# Patient Record
Sex: Male | Born: 1940 | Race: White | Hispanic: No | Marital: Married | Smoking: Former smoker
Health system: Southern US, Community
[De-identification: ages and names within clinical notes are randomized; demographics above are authoritative.]

## PROBLEM LIST (undated history)

## (undated) DIAGNOSIS — D696 Thrombocytopenia, unspecified: Secondary | ICD-10-CM

## (undated) DIAGNOSIS — K30 Functional dyspepsia: Secondary | ICD-10-CM

## (undated) DIAGNOSIS — I723 Aneurysm of iliac artery: Secondary | ICD-10-CM

## (undated) DIAGNOSIS — I701 Atherosclerosis of renal artery: Secondary | ICD-10-CM

## (undated) DIAGNOSIS — I2510 Atherosclerotic heart disease of native coronary artery without angina pectoris: Secondary | ICD-10-CM

## (undated) DIAGNOSIS — I712 Thoracic aortic aneurysm, without rupture, unspecified: Secondary | ICD-10-CM

## (undated) DIAGNOSIS — I714 Abdominal aortic aneurysm, without rupture, unspecified: Secondary | ICD-10-CM

## (undated) DIAGNOSIS — IMO0001 Reserved for inherently not codable concepts without codable children: Secondary | ICD-10-CM

## (undated) DIAGNOSIS — D693 Immune thrombocytopenic purpura: Secondary | ICD-10-CM

## (undated) DIAGNOSIS — I10 Essential (primary) hypertension: Secondary | ICD-10-CM

## (undated) DIAGNOSIS — I82409 Acute embolism and thrombosis of unspecified deep veins of unspecified lower extremity: Secondary | ICD-10-CM

## (undated) DIAGNOSIS — E7800 Pure hypercholesterolemia, unspecified: Secondary | ICD-10-CM

## (undated) DIAGNOSIS — R9431 Abnormal electrocardiogram [ECG] [EKG]: Secondary | ICD-10-CM

## (undated) DIAGNOSIS — R110 Nausea: Secondary | ICD-10-CM

## (undated) DIAGNOSIS — I739 Peripheral vascular disease, unspecified: Secondary | ICD-10-CM

## (undated) HISTORY — DX: Thoracic aortic aneurysm, without rupture, unspecified: I71.20

## (undated) HISTORY — PX: ROTATOR CUFF REPAIR W/ DISTAL CLAVICLE EXCISION: SHX2365

## (undated) HISTORY — DX: Abdominal aortic aneurysm, without rupture: I71.4

## (undated) HISTORY — PX: LUMBAR SPINE SURGERY: SHX701

## (undated) HISTORY — DX: Nausea: R11.0

## (undated) HISTORY — DX: Reserved for inherently not codable concepts without codable children: IMO0001

## (undated) HISTORY — PX: CHOLECYSTECTOMY: SHX55

## (undated) HISTORY — PX: ABDOMINAL AORTIC ANEURYSM REPAIR: SUR1152

## (undated) HISTORY — PX: INGUINAL HERNIA REPAIR: SHX194

## (undated) HISTORY — DX: Abnormal electrocardiogram (ECG) (EKG): R94.31

## (undated) HISTORY — DX: Atherosclerotic heart disease of native coronary artery without angina pectoris: I25.10

## (undated) HISTORY — DX: Aneurysm of iliac artery: I72.3

## (undated) HISTORY — DX: Essential (primary) hypertension: I10

## (undated) HISTORY — DX: Pure hypercholesterolemia, unspecified: E78.00

## (undated) HISTORY — DX: Acute embolism and thrombosis of unspecified deep veins of unspecified lower extremity: I82.409

## (undated) HISTORY — DX: Functional dyspepsia: K30

## (undated) HISTORY — DX: Immune thrombocytopenic purpura: D69.3

## (undated) HISTORY — DX: Peripheral vascular disease, unspecified: I73.9

## (undated) HISTORY — DX: Thoracic aortic aneurysm, without rupture: I71.2

## (undated) HISTORY — DX: Atherosclerosis of renal artery: I70.1

## (undated) HISTORY — DX: Thrombocytopenia, unspecified: D69.6

## (undated) HISTORY — PX: ILIAC ARTERY ANEURYSM REPAIR: SUR1158

## (undated) HISTORY — DX: Abdominal aortic aneurysm, without rupture, unspecified: I71.40

## (undated) HISTORY — PX: UMBILICAL HERNIA REPAIR: SHX196

---

## 2006-12-24 ENCOUNTER — Ambulatory Visit: Payer: Self-pay | Admitting: Vascular Surgery

## 2007-03-03 ENCOUNTER — Encounter: Admission: RE | Admit: 2007-03-03 | Discharge: 2007-03-03 | Payer: Self-pay | Admitting: Sports Medicine

## 2008-02-10 ENCOUNTER — Ambulatory Visit: Payer: Self-pay | Admitting: Vascular Surgery

## 2008-03-05 HISTORY — PX: CARDIAC CATHETERIZATION: SHX172

## 2008-08-06 ENCOUNTER — Encounter: Admission: RE | Admit: 2008-08-06 | Discharge: 2008-08-06 | Payer: Self-pay | Admitting: Vascular Surgery

## 2008-08-10 ENCOUNTER — Ambulatory Visit: Payer: Self-pay | Admitting: Surgery

## 2008-08-30 ENCOUNTER — Encounter: Admission: RE | Admit: 2008-08-30 | Discharge: 2008-08-30 | Payer: Self-pay | Admitting: Orthopedic Surgery

## 2008-11-16 ENCOUNTER — Inpatient Hospital Stay (HOSPITAL_COMMUNITY): Admission: RE | Admit: 2008-11-16 | Discharge: 2008-11-18 | Payer: Self-pay | Admitting: *Deleted

## 2008-11-17 ENCOUNTER — Ambulatory Visit: Payer: Self-pay | Admitting: Vascular Surgery

## 2008-11-17 ENCOUNTER — Encounter: Payer: Self-pay | Admitting: Surgery

## 2009-05-20 ENCOUNTER — Ambulatory Visit (HOSPITAL_COMMUNITY): Admission: RE | Admit: 2009-05-20 | Discharge: 2009-05-21 | Payer: Self-pay | Admitting: Orthopedic Surgery

## 2009-08-16 ENCOUNTER — Ambulatory Visit: Payer: Self-pay | Admitting: Surgery

## 2009-08-16 ENCOUNTER — Encounter: Admission: RE | Admit: 2009-08-16 | Discharge: 2009-08-16 | Payer: Self-pay | Admitting: Surgery

## 2009-11-03 ENCOUNTER — Encounter: Admission: RE | Admit: 2009-11-03 | Discharge: 2009-11-03 | Payer: Self-pay | Admitting: Internal Medicine

## 2009-11-10 ENCOUNTER — Ambulatory Visit: Payer: Self-pay | Admitting: Oncology

## 2009-11-17 LAB — CBC WITH DIFFERENTIAL/PLATELET
Basophils Absolute: 0 10*3/uL (ref 0.0–0.1)
Eosinophils Absolute: 0.2 10*3/uL (ref 0.0–0.5)
HCT: 43.7 % (ref 38.4–49.9)
HGB: 14.4 g/dL (ref 13.0–17.1)
MCH: 27.6 pg (ref 27.2–33.4)
MONO#: 0.5 10*3/uL (ref 0.1–0.9)
NEUT#: 5 10*3/uL (ref 1.5–6.5)
NEUT%: 76.7 % — ABNORMAL HIGH (ref 39.0–75.0)
WBC: 6.5 10*3/uL (ref 4.0–10.3)
lymph#: 0.8 10*3/uL — ABNORMAL LOW (ref 0.9–3.3)

## 2009-11-17 LAB — MORPHOLOGY: PLT EST: DECREASED

## 2009-11-17 LAB — CHCC SMEAR

## 2009-11-18 LAB — HIV ANTIBODY (ROUTINE TESTING W REFLEX)

## 2009-11-21 LAB — ANTI-NUCLEAR AB-TITER (ANA TITER): ANA Titer 1: NEGATIVE

## 2009-11-21 LAB — PROTEIN ELECTROPHORESIS, SERUM
Albumin ELP: 61.8 % (ref 55.8–66.1)
Alpha-1-Globulin: 4.2 % (ref 2.9–4.9)
Alpha-2-Globulin: 10.4 % (ref 7.1–11.8)
Beta 2: 3.8 % (ref 3.2–6.5)
Beta Globulin: 6.3 % (ref 4.7–7.2)
Gamma Globulin: 13.5 % (ref 11.1–18.8)
Total Protein, Serum Electrophoresis: 6.9 g/dL (ref 6.0–8.3)

## 2009-11-21 LAB — HEPATITIS B SURFACE ANTIGEN: Hepatitis B Surface Ag: NEGATIVE

## 2009-11-21 LAB — HEPATITIS C ANTIBODY: HCV Ab: NEGATIVE

## 2009-11-21 LAB — COMPREHENSIVE METABOLIC PANEL WITH GFR
ALT: 57 U/L — ABNORMAL HIGH (ref 0–53)
AST: 60 U/L — ABNORMAL HIGH (ref 0–37)
Albumin: 4.6 g/dL (ref 3.5–5.2)
Alkaline Phosphatase: 51 U/L (ref 39–117)
BUN: 25 mg/dL — ABNORMAL HIGH (ref 6–23)
CO2: 29 meq/L (ref 19–32)
Calcium: 9.5 mg/dL (ref 8.4–10.5)
Chloride: 104 meq/L (ref 96–112)
Creatinine, Ser: 1.5 mg/dL (ref 0.40–1.50)
Glucose, Bld: 91 mg/dL (ref 70–99)
Potassium: 4.8 meq/L (ref 3.5–5.3)
Sodium: 142 meq/L (ref 135–145)
Total Bilirubin: 0.8 mg/dL (ref 0.3–1.2)
Total Protein: 6.9 g/dL (ref 6.0–8.3)

## 2009-11-21 LAB — VITAMIN B12: Vitamin B-12: 336 pg/mL (ref 211–911)

## 2009-11-21 LAB — ANA: Anti Nuclear Antibody(ANA): POSITIVE — AB

## 2009-11-21 LAB — LACTATE DEHYDROGENASE: LDH: 289 U/L — ABNORMAL HIGH (ref 94–250)

## 2009-11-21 LAB — HEPATITIS B CORE ANTIBODY, IGM: Hep B C IgM: NEGATIVE

## 2009-11-21 LAB — HEPATITIS B CORE ANTIBODY, TOTAL: Hep B Core Total Ab: NEGATIVE

## 2009-11-21 LAB — FOLATE: Folate: >20 ng/mL

## 2009-11-29 ENCOUNTER — Ambulatory Visit (HOSPITAL_COMMUNITY): Admission: RE | Admit: 2009-11-29 | Discharge: 2009-11-29 | Payer: Self-pay | Admitting: Oncology

## 2009-12-15 ENCOUNTER — Ambulatory Visit: Payer: Self-pay | Admitting: Oncology

## 2009-12-16 LAB — CBC WITH DIFFERENTIAL/PLATELET
BASO%: 0.4 % (ref 0.0–2.0)
Basophils Absolute: 0 10*3/uL (ref 0.0–0.1)
EOS%: 3.5 % (ref 0.0–7.0)
HGB: 14.2 g/dL (ref 13.0–17.1)
MCH: 29.1 pg (ref 27.2–33.4)
MONO#: 0.5 10*3/uL (ref 0.1–0.9)
RDW: 14 % (ref 11.0–14.6)
WBC: 6.6 10*3/uL (ref 4.0–10.3)
lymph#: 0.9 10*3/uL (ref 0.9–3.3)

## 2009-12-20 ENCOUNTER — Ambulatory Visit: Payer: Self-pay | Admitting: Cardiology

## 2009-12-20 LAB — CBC WITH DIFFERENTIAL/PLATELET
Eosinophils Absolute: 0.1 10*3/uL (ref 0.0–0.5)
MONO#: 0.5 10*3/uL (ref 0.1–0.9)
NEUT#: 8.7 10*3/uL — ABNORMAL HIGH (ref 1.5–6.5)
Platelets: 85 10*3/uL — ABNORMAL LOW (ref 140–400)
RBC: 4.9 10*6/uL (ref 4.20–5.82)
RDW: 14.8 % — ABNORMAL HIGH (ref 11.0–14.6)
WBC: 10.1 10*3/uL (ref 4.0–10.3)

## 2009-12-28 LAB — CBC WITH DIFFERENTIAL/PLATELET
Eosinophils Absolute: 0.1 10*3/uL (ref 0.0–0.5)
HCT: 44.8 % (ref 38.4–49.9)
HGB: 15.6 g/dL (ref 13.0–17.1)
LYMPH%: 12.5 % — ABNORMAL LOW (ref 14.0–49.0)
MONO#: 0.7 10*3/uL (ref 0.1–0.9)
NEUT#: 10.8 10*3/uL — ABNORMAL HIGH (ref 1.5–6.5)
NEUT%: 80.7 % — ABNORMAL HIGH (ref 39.0–75.0)
Platelets: 100 10*3/uL — ABNORMAL LOW (ref 140–400)
WBC: 13.4 10*3/uL — ABNORMAL HIGH (ref 4.0–10.3)

## 2009-12-28 LAB — COMPREHENSIVE METABOLIC PANEL
CO2: 24 mEq/L (ref 19–32)
Creatinine, Ser: 1.13 mg/dL (ref 0.40–1.50)
Glucose, Bld: 90 mg/dL (ref 70–99)
Sodium: 131 mEq/L — ABNORMAL LOW (ref 135–145)
Total Bilirubin: 0.5 mg/dL (ref 0.3–1.2)
Total Protein: 6.4 g/dL (ref 6.0–8.3)

## 2009-12-28 LAB — LACTATE DEHYDROGENASE: LDH: 207 U/L (ref 94–250)

## 2010-01-04 LAB — CBC WITH DIFFERENTIAL/PLATELET
BASO%: 0 % (ref 0.0–2.0)
EOS%: 0.3 % (ref 0.0–7.0)
MCH: 29.4 pg (ref 27.2–33.4)
MCV: 84.8 fL (ref 79.3–98.0)
MONO%: 1 % (ref 0.0–14.0)
RBC: 5.21 10*6/uL (ref 4.20–5.82)
RDW: 14.3 % (ref 11.0–14.6)
lymph#: 0.4 10*3/uL — ABNORMAL LOW (ref 0.9–3.3)

## 2010-01-11 LAB — COMPREHENSIVE METABOLIC PANEL
Albumin: 4.1 g/dL (ref 3.5–5.2)
Alkaline Phosphatase: 40 U/L (ref 39–117)
CO2: 26 mEq/L (ref 19–32)
Chloride: 97 mEq/L (ref 96–112)
Glucose, Bld: 127 mg/dL — ABNORMAL HIGH (ref 70–99)
Potassium: 4.2 mEq/L (ref 3.5–5.3)
Sodium: 131 mEq/L — ABNORMAL LOW (ref 135–145)
Total Protein: 6.2 g/dL (ref 6.0–8.3)

## 2010-01-11 LAB — CBC WITH DIFFERENTIAL/PLATELET
Eosinophils Absolute: 0 10*3/uL (ref 0.0–0.5)
MONO#: 0.2 10*3/uL (ref 0.1–0.9)
MONO%: 2 % (ref 0.0–14.0)
NEUT#: 8.3 10*3/uL — ABNORMAL HIGH (ref 1.5–6.5)
RBC: 5.08 10*6/uL (ref 4.20–5.82)
RDW: 14.2 % (ref 11.0–14.6)
WBC: 8.9 10*3/uL (ref 4.0–10.3)
lymph#: 0.4 10*3/uL — ABNORMAL LOW (ref 0.9–3.3)

## 2010-01-16 ENCOUNTER — Ambulatory Visit: Payer: Self-pay | Admitting: Oncology

## 2010-01-18 LAB — CBC WITH DIFFERENTIAL/PLATELET
EOS%: 0.6 % (ref 0.0–7.0)
Eosinophils Absolute: 0 10*3/uL (ref 0.0–0.5)
MCV: 85.2 fL (ref 79.3–98.0)
MONO%: 2.3 % (ref 0.0–14.0)
NEUT#: 7.6 10*3/uL — ABNORMAL HIGH (ref 1.5–6.5)
RBC: 4.68 10*6/uL (ref 4.20–5.82)
RDW: 14.1 % (ref 11.0–14.6)

## 2010-01-27 LAB — COMPREHENSIVE METABOLIC PANEL
Albumin: 4.3 g/dL (ref 3.5–5.2)
CO2: 28 mEq/L (ref 19–32)
Glucose, Bld: 161 mg/dL — ABNORMAL HIGH (ref 70–99)
Potassium: 4.2 mEq/L (ref 3.5–5.3)
Sodium: 134 mEq/L — ABNORMAL LOW (ref 135–145)
Total Protein: 6.1 g/dL (ref 6.0–8.3)

## 2010-01-27 LAB — CBC WITH DIFFERENTIAL/PLATELET
Eosinophils Absolute: 0 10*3/uL (ref 0.0–0.5)
MONO#: 0.2 10*3/uL (ref 0.1–0.9)
NEUT#: 7.4 10*3/uL — ABNORMAL HIGH (ref 1.5–6.5)
RBC: 4.88 10*6/uL (ref 4.20–5.82)
RDW: 14.5 % (ref 11.0–14.6)
WBC: 8.4 10*3/uL (ref 4.0–10.3)

## 2010-02-09 LAB — CBC WITH DIFFERENTIAL/PLATELET
BASO%: 0.3 % (ref 0.0–2.0)
Basophils Absolute: 0 10*3/uL (ref 0.0–0.1)
EOS%: 0.9 % (ref 0.0–7.0)
HCT: 38.1 % — ABNORMAL LOW (ref 38.4–49.9)
HGB: 13.2 g/dL (ref 13.0–17.1)
LYMPH%: 9.6 % — ABNORMAL LOW (ref 14.0–49.0)
MCH: 28.7 pg (ref 27.2–33.4)
MCHC: 34.6 g/dL (ref 32.0–36.0)
MCV: 82.8 fL (ref 79.3–98.0)
MONO%: 6.2 % (ref 0.0–14.0)
NEUT%: 83 % — ABNORMAL HIGH (ref 39.0–75.0)
lymph#: 0.8 10*3/uL — ABNORMAL LOW (ref 0.9–3.3)

## 2010-02-20 ENCOUNTER — Ambulatory Visit: Payer: Self-pay | Admitting: Oncology

## 2010-02-21 LAB — CBC WITH DIFFERENTIAL/PLATELET
BASO%: 1.1 % (ref 0.0–2.0)
Basophils Absolute: 0.1 10*3/uL (ref 0.0–0.1)
EOS%: 1.2 % (ref 0.0–7.0)
HCT: 37.8 % — ABNORMAL LOW (ref 38.4–49.9)
HGB: 13.1 g/dL (ref 13.0–17.1)
MCH: 29.6 pg (ref 27.2–33.4)
MCHC: 34.8 g/dL (ref 32.0–36.0)
MCV: 85.1 fL (ref 79.3–98.0)
MONO%: 4.1 % (ref 0.0–14.0)
NEUT%: 85.7 % — ABNORMAL HIGH (ref 39.0–75.0)
RDW: 15.3 % — ABNORMAL HIGH (ref 11.0–14.6)

## 2010-02-21 LAB — COMPREHENSIVE METABOLIC PANEL
ALT: 34 U/L (ref 0–53)
AST: 32 U/L (ref 0–37)
Alkaline Phosphatase: 38 U/L — ABNORMAL LOW (ref 39–117)
BUN: 16 mg/dL (ref 6–23)
Calcium: 9.1 mg/dL (ref 8.4–10.5)
Chloride: 102 mEq/L (ref 96–112)
Creatinine, Ser: 0.92 mg/dL (ref 0.40–1.50)
Total Bilirubin: 0.4 mg/dL (ref 0.3–1.2)

## 2010-02-28 LAB — CBC WITH DIFFERENTIAL/PLATELET
BASO%: 0.2 % (ref 0.0–2.0)
Basophils Absolute: 0 10*3/uL (ref 0.0–0.1)
EOS%: 0.8 % (ref 0.0–7.0)
HCT: 38.2 % — ABNORMAL LOW (ref 38.4–49.9)
HGB: 13.4 g/dL (ref 13.0–17.1)
LYMPH%: 6.3 % — ABNORMAL LOW (ref 14.0–49.0)
MCH: 29.9 pg (ref 27.2–33.4)
MCHC: 35 g/dL (ref 32.0–36.0)
MCV: 85.4 fL (ref 79.3–98.0)
NEUT%: 88.1 % — ABNORMAL HIGH (ref 39.0–75.0)
Platelets: 102 10*3/uL — ABNORMAL LOW (ref 140–400)
lymph#: 0.6 10*3/uL — ABNORMAL LOW (ref 0.9–3.3)

## 2010-03-22 ENCOUNTER — Ambulatory Visit: Payer: Self-pay | Admitting: Oncology

## 2010-03-23 LAB — COMPREHENSIVE METABOLIC PANEL
ALT: 20 U/L (ref 0–53)
AST: 20 U/L (ref 0–37)
Albumin: 4.5 g/dL (ref 3.5–5.2)
Alkaline Phosphatase: 37 U/L — ABNORMAL LOW (ref 39–117)
BUN: 15 mg/dL (ref 6–23)
CO2: 28 mEq/L (ref 19–32)
Calcium: 9.7 mg/dL (ref 8.4–10.5)
Chloride: 103 mEq/L (ref 96–112)
Creatinine, Ser: 1.04 mg/dL (ref 0.40–1.50)
Glucose, Bld: 111 mg/dL — ABNORMAL HIGH (ref 70–99)
Potassium: 3.9 mEq/L (ref 3.5–5.3)
Sodium: 140 mEq/L (ref 135–145)
Total Bilirubin: 0.5 mg/dL (ref 0.3–1.2)
Total Protein: 6.5 g/dL (ref 6.0–8.3)

## 2010-03-23 LAB — CBC WITH DIFFERENTIAL/PLATELET
BASO%: 0.3 % (ref 0.0–2.0)
Basophils Absolute: 0 10*3/uL (ref 0.0–0.1)
EOS%: 1.4 % (ref 0.0–7.0)
Eosinophils Absolute: 0.1 10*3/uL (ref 0.0–0.5)
HCT: 39.6 % (ref 38.4–49.9)
HGB: 13.3 g/dL (ref 13.0–17.1)
LYMPH%: 7.1 % — ABNORMAL LOW (ref 14.0–49.0)
MCH: 28.9 pg (ref 27.2–33.4)
MCHC: 33.6 g/dL (ref 32.0–36.0)
MCV: 85.9 fL (ref 79.3–98.0)
MONO#: 0.5 10*3/uL (ref 0.1–0.9)
MONO%: 6 % (ref 0.0–14.0)
NEUT#: 6.6 10*3/uL — ABNORMAL HIGH (ref 1.5–6.5)
NEUT%: 85.2 % — ABNORMAL HIGH (ref 39.0–75.0)
Platelets: 116 10*3/uL — ABNORMAL LOW (ref 140–400)
RBC: 4.61 10*6/uL (ref 4.20–5.82)
RDW: 15.6 % — ABNORMAL HIGH (ref 11.0–14.6)
WBC: 7.8 10*3/uL (ref 4.0–10.3)
lymph#: 0.6 10*3/uL — ABNORMAL LOW (ref 0.9–3.3)

## 2010-03-26 ENCOUNTER — Encounter: Payer: Self-pay | Admitting: Sports Medicine

## 2010-03-27 ENCOUNTER — Ambulatory Visit: Payer: Self-pay | Admitting: Cardiology

## 2010-04-20 ENCOUNTER — Encounter (HOSPITAL_BASED_OUTPATIENT_CLINIC_OR_DEPARTMENT_OTHER): Payer: Medicare Other | Admitting: Oncology

## 2010-04-20 ENCOUNTER — Other Ambulatory Visit: Payer: Self-pay | Admitting: Oncology

## 2010-04-20 DIAGNOSIS — D693 Immune thrombocytopenic purpura: Secondary | ICD-10-CM

## 2010-04-20 DIAGNOSIS — IMO0002 Reserved for concepts with insufficient information to code with codable children: Secondary | ICD-10-CM

## 2010-04-20 LAB — CBC WITH DIFFERENTIAL/PLATELET
BASO%: 0.5 % (ref 0.0–2.0)
HCT: 35.8 % — ABNORMAL LOW (ref 38.4–49.9)
MCHC: 34.5 g/dL (ref 32.0–36.0)
MONO#: 0.4 10*3/uL (ref 0.1–0.9)
NEUT%: 80.3 % — ABNORMAL HIGH (ref 39.0–75.0)
RDW: 14.5 % (ref 11.0–14.6)
WBC: 7.5 10*3/uL (ref 4.0–10.3)
lymph#: 0.8 10*3/uL — ABNORMAL LOW (ref 0.9–3.3)

## 2010-05-17 ENCOUNTER — Encounter (HOSPITAL_BASED_OUTPATIENT_CLINIC_OR_DEPARTMENT_OTHER): Payer: Medicare Other | Admitting: Oncology

## 2010-05-17 ENCOUNTER — Other Ambulatory Visit: Payer: Self-pay | Admitting: Oncology

## 2010-05-17 DIAGNOSIS — D696 Thrombocytopenia, unspecified: Secondary | ICD-10-CM

## 2010-05-17 DIAGNOSIS — IMO0002 Reserved for concepts with insufficient information to code with codable children: Secondary | ICD-10-CM

## 2010-05-17 DIAGNOSIS — D693 Immune thrombocytopenic purpura: Secondary | ICD-10-CM

## 2010-05-17 LAB — CBC WITH DIFFERENTIAL/PLATELET
BASO%: 0.5 % (ref 0.0–2.0)
Eosinophils Absolute: 0.2 10*3/uL (ref 0.0–0.5)
MCV: 84.3 fL (ref 79.3–98.0)
MONO#: 0.3 10*3/uL (ref 0.1–0.9)
MONO%: 5.4 % (ref 0.0–14.0)
NEUT#: 4.4 10*3/uL (ref 1.5–6.5)
RBC: 4.22 10*6/uL (ref 4.20–5.82)
RDW: 13.5 % (ref 11.0–14.6)
WBC: 5.8 10*3/uL (ref 4.0–10.3)

## 2010-05-17 LAB — COMPREHENSIVE METABOLIC PANEL
ALT: 18 U/L (ref 0–53)
Albumin: 4.1 g/dL (ref 3.5–5.2)
Alkaline Phosphatase: 44 U/L (ref 39–117)
Glucose, Bld: 101 mg/dL — ABNORMAL HIGH (ref 70–99)
Potassium: 4.6 mEq/L (ref 3.5–5.3)
Sodium: 139 mEq/L (ref 135–145)
Total Protein: 6.4 g/dL (ref 6.0–8.3)

## 2010-05-18 LAB — BONE MARROW EXAM: Bone Marrow Exam: 693

## 2010-05-18 LAB — PROTIME-INR: Prothrombin Time: 14.8 seconds (ref 11.6–15.2)

## 2010-05-18 LAB — CBC
MCH: 29 pg (ref 26.0–34.0)
MCHC: 34.6 g/dL (ref 30.0–36.0)
Platelets: 50 10*3/uL — ABNORMAL LOW (ref 150–400)
RBC: 4.82 MIL/uL (ref 4.22–5.81)
RDW: 14.5 % (ref 11.5–15.5)

## 2010-05-29 LAB — CBC
Platelets: 51 10*3/uL — ABNORMAL LOW (ref 150–400)
RDW: 15.4 % (ref 11.5–15.5)

## 2010-05-29 LAB — BASIC METABOLIC PANEL
BUN: 13 mg/dL (ref 6–23)
Calcium: 9.1 mg/dL (ref 8.4–10.5)
GFR calc non Af Amer: >60 mL/min (ref >60)
Glucose, Bld: 82 mg/dL (ref 70–99)

## 2010-06-09 LAB — CBC
Hemoglobin: 11.7 g/dL — ABNORMAL LOW (ref 13.0–17.0)
MCHC: 34.9 g/dL (ref 30.0–36.0)
MCV: 85.4 fL (ref 78.0–100.0)
MCV: 85.5 fL (ref 78.0–100.0)
Platelets: 55 10*3/uL — ABNORMAL LOW (ref 150–400)
RBC: 4.08 MIL/uL — ABNORMAL LOW (ref 4.22–5.81)
RDW: 14.1 % (ref 11.5–15.5)
WBC: 5.5 10*3/uL (ref 4.0–10.5)

## 2010-06-09 LAB — BASIC METABOLIC PANEL
Calcium: 8.6 mg/dL (ref 8.4–10.5)
Chloride: 107 mEq/L (ref 96–112)
Creatinine, Ser: 1.12 mg/dL (ref 0.4–1.5)
GFR calc Af Amer: >60 mL/min (ref >60)
GFR calc non Af Amer: >60 mL/min (ref >60)

## 2010-06-09 LAB — TROPONIN I
Troponin I: 0.04 ng/mL (ref 0.00–0.06)
Troponin I: 0.3 ng/mL — ABNORMAL HIGH (ref 0.00–0.06)

## 2010-06-09 LAB — COMPREHENSIVE METABOLIC PANEL
ALT: 31 U/L (ref 0–53)
Calcium: 9 mg/dL (ref 8.4–10.5)
Creatinine, Ser: 1.11 mg/dL (ref 0.4–1.5)
GFR calc Af Amer: >60 mL/min (ref >60)
Glucose, Bld: 107 mg/dL — ABNORMAL HIGH (ref 70–99)
Sodium: 142 mEq/L (ref 135–145)
Total Protein: 5.8 g/dL — ABNORMAL LOW (ref 6.0–8.3)

## 2010-06-09 LAB — CARDIAC PANEL(CRET KIN+CKTOT+MB+TROPI)
Relative Index: INVALID (ref 0.0–2.5)
Total CK: 56 U/L (ref 7–232)

## 2010-06-09 LAB — CK TOTAL AND CKMB (NOT AT ARMC)
CK, MB: 1.8 ng/mL (ref 0.3–4.0)
CK, MB: 2.9 ng/mL (ref 0.3–4.0)
Relative Index: INVALID (ref 0.0–2.5)
Relative Index: INVALID (ref 0.0–2.5)
Total CK: 93 U/L (ref 7–232)

## 2010-07-18 NOTE — Assessment & Plan Note (Signed)
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  I had last seen him in followup in  October of 2008 when he was having some mild left groin pain.  I did not  feel a hernia and he had elected not to consider a CT of his abdomen to  work this up further.  These symptoms have essentially resolved although  he occasionally has some mild groin pain.  He had recently presented for  cholecystectomy and underwent open cholecystectomy.  He apparently had  suspicion for a pulmonary embolus postoperatively and a CT angiogram of  the chest was obtained.  This showed no evidence of pulmonary embolus.  An incidental finding was mild ectasia of the ascending aorta with a  maximum diameter of 4.1 cm.  He was referred to our office.   The patient has had no significant abdominal or back pain.  He has had  no recent chest pain, chest pressure, palpitations or pleuritic chest  pain.   REVIEW OF SYSTEMS:  He does occasionally have some wheezing and has a  history of COPD.  He also has a history of reflux.  In addition he has  arthritis and joint pain.  His review of systems is otherwise  unremarkable.   PHYSICAL EXAMINATION:  General:  This is a pleasant 70 year old  gentleman who appears his stated age.  Vital signs:  His blood pressure  is 102/71, heart rate is 84.  Neck:  Supple.  I do not detect any  carotid bruits.  Lungs:  Are clear bilaterally to auscultation.  Cardiac:  He has a regular rate and rhythm.  I do not appreciate any  significant murmur.  Abdomen:  Soft and nontender.  There is no evidence  of a hernia.  He has palpable femoral pulses and  warm well-perfused feet  without ischemic ulcers.   I explained to the patient that I do not treat vascular disease in the  chest, however, I did discuss the case today with Dr. Laneta Simmers.  I have  explained to the patient that after discussion with Dr. Laneta Simmers that  normally this aneurysm of the ascending aorta would be treated if it  reached 5.5 cm in maximum diameter or became symptomatic.  As he is  asymptomatic and the aneurysm is 4.1 cm in size Dr. Laneta Simmers has  recommended a followup CT scan in 6 months and we will arrange for the  patient to see Dr. Laneta Simmers after his followup CT scan is obtained in 6  months.  If he develops any new chest symptoms prior to this we will  arrange for him to be seen sooner.   Di Kindle. Edilia Bo, M.D.  Electronically Signed   CSD/MEDQ  D:  02/10/2008  T:  02/11/2008  Job:  [id number]   cc:   Valere Dross, M.D.

## 2010-07-18 NOTE — Consult Note (Signed)
NEW PATIENT CONSULTATION   Drew Cisneros, VIERNES  DOB:  [birth year]                                        August 10, 2008  CHART #:  [id number]   REFERRING PHYSICIAN:  Di Kindle. Edilia Bo, MD   REASON FOR CONSULTATION:  Enlargement of the ascending aorta.   CLINICAL HISTORY:  I asked by Dr. Edilia Bo to evaluate the patient for  enlargement of his ascending aorta.  He is a 70 year old gentleman with  a history of vascular disease who had undergone repair of an abdominal  aortic aneurysm and bilateral common iliac aneurysms in January 1999.  The patient had a open cholecystectomy performed in September 2009, and  there was a suspicion of pulmonary embolus postoperatively prompting a  CT scan of the chest.  This did not show any pulmonary embolus but did  show mild ectasia of the ascending aorta with a maximum diameter of 4.1  cm.  He was referred back to Dr. Adele Dan office for followup of this.  I discuss this in the hospital with Dr. Edilia Bo and recommended  repeating the scan in about 6 months and having the patient followup  with me in the office.  The patient does report that over the past 2  weeks he has had a couple episodes of substernal chest pain and pressure  with increased exertion.  He said that he has been going to the [facility] and  walking on a treadmill several times a week over the past 6 weeks.  He  walks at about 3 miles per hour for 2 miles and has had no symptoms with  that.  He said that when he had the substernal chest pain he was outside  and had to suddenly take out his pace with development of this symptom.  It resolved with rest.  His wife also reports that he has had decreased  energy level and exertional fatigue for at least the last 6-12 months.  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   REVIEW OF SYSTEMS:  GENERAL:  He denies any fever or chills.  He has had  no recent weight changes.  He has had  exertional fatigue.  EYES:  Negative.  ENT:  Negative.  ENDOCRINE:  He denies diabetes and hypothyroidism.  CARDIOVASCULAR:  As above.  He has recently had substernal chest pain  and pressure and said that he has had symptoms of heartburn and  indigestion for longer than that.  He has had exertional shortness of  breath.  He denies any symptoms at rest.  He has had no PND or  orthopnea.  He denies peripheral edema or palpitations.  RESPIRATORY:  He denies cough and sputum production.  GI:  He has had no nausea or vomiting.  Denies melena and bright red  blood per rectum.  GU:  He denies dysuria and hematuria.  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  NEUROLOGIC:  He denies any focal weakness or numbness.  Denies dizziness  or syncope.  He has never had a TIA or stroke.  PSYCHIATRIC:  Negative.  HEMATOLOGIC:  Negative.   ALLERGIES:  Penicillin which causes a rash, codeine which cause a  headache, and Compazine which causes muscle cramps.   MEDICATIONS:  1. Propranolol 40 mg t.i.d.  2. Doxazosin 8 mg daily.  3. Cetirizine 10 mg daily p.r.n.   PAST MEDICAL HISTORY:  Significant for hypertension and hyperlipidemia.  He has a history of peripheral vascular disease status post abdominal  aortic aneurysm repair and bilateral common iliac aneurysm repair in  1999.  He has a history of prior umbilical hernia and groin hernia  repair by Dr. Avel Peace in the past.  He is status post open  cholecystectomy in September 2009, performed by a surgeon in [place].   SOCIAL HISTORY:  He is a remote smoker, quit in 1994.  He is married and  has 2 children.  He is retired and lives with his wife.  He denies  alcohol use.   FAMILY HISTORY:  His mother has diabetes.  He has a brother who has had  cardiac stents as well as has diabetes.   PHYSICAL  EXAMINATION:  Blood pressure 123/84, pulse 79 and regular, his  respiratory rate is 16 and unlabored.  Oxygen saturation on room air is  96%.  He is a well-developed white male in no distress.  HEENT exam  shows normocephalic and atraumatic.  Pupils are equal and reactive to  light and accommodation.  Extraocular muscles are intact.  His throat is  clear.  Neck exam shows normal carotid pulses bilaterally.  There are no  bruits.  There is no adenopathy or thyromegaly.  Cardiac exam shows  regular rate and rhythm with a grade 1/6 systolic murmur at the apex.  His lungs are clear.  Abdominal exam shows active bowel sounds.  His  abdomen is soft, protuberant, and nontender.  There are no palpable  masses or organomegaly.  There are well-healed scars in the right upper  quadrant well as in the periumbilical region.  Extremity exam shows no  peripheral edema.  Feet are warm and well perfused.  Neurologic exam  shows him to be alert and oriented x3.  Motor and sensory exams are  grossly normal.   DIAGNOSTIC TESTS:  I have reviewed his CT scan of the chest without  contrast on August 06, 2008.  This shows mild ectasia of the ascending  aorta with maximum diameter of 40 mm.  There is no focal aneurysm.  Descending thoracic aorta measured about 34 mm in maximum diameter.  The  last scan through the upper abdomen does show focal dilatation of the  infrarenal abdominal aorta measuring 44 mm.  There are coronary  calcifications noted.  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  There is a probable cyst on the upper pole of the left  kidney.  The spleen is mildly enlarged.   IMPRESSION:  The patient has mild ectasia of the ascending aorta of  unknown duration.  His scan has not changed from September to present.  I recommended that we repeat his CT scan in 1 year to establish  stability, and if the aorta is unchanged then I would wait several years  for repeating a scan.  Of  more concern to me is that he does have  symptoms suggesting new-onset exertional angina.  He has multiple risk  factors for coronary disease and had coronary calcifications noted on CT  scan.  He had a stress test performed in 1999 prior to his abdominal  aneurysm repair and said that  did not show any problem.  He does not  currently have a primary physician following him since he just was  trying to change physicians.  I will refer him to Dr. Peter [place] of  [facility] for cardiology evaluation.  I discussed this with  the patient and he is in full gradient.  I will plan to see him back in  1 year for followup of his ascending aorta.   Evelene Croon, M.D.  Electronically Signed   BB/MEDQ  D:  08/10/2008  T:  08/11/2008  Job:  [id number]   cc:   Di Kindle. Edilia Bo, M.D.  Peter M. [place]and, M.D.

## 2010-07-18 NOTE — Consult Note (Signed)
VASCULAR SURGERY CONSULTATION   JESHURUN, OAXACA  DOB:  [birth year]                                       12/24/2006  WUJWJ#:[id number]   I saw the patient in the office today in consultation concerning some  left groin pain.  This began gradually about three months ago.  This has  remained relatively constant over the last three months.  When he  medially rotates his left leg, this aggravates the pain.  Ambulation  sometimes aggravates the pain.  There are no real alleviating factors.  He has had no associated fever, nausea, or vomiting.   He has been examined by Dr. Samuel Germany, and there was no evidence of a hernia.   He underwent repair of an abdominal aortic aneurysm and bilateral common  iliac artery aneurysms in January, 1999 in addition to the repair of an  umbilical hernia.  He was concerned that the pain he is having in his  groin might be potentially related to his graft.   PAST MEDICAL HISTORY:  Significant for hypertension.  He denies any  history of diabetes, hypercholesterolemia, history of previous  myocardial infarction, history of congestive heart failure, or history  of COPD.   FAMILY HISTORY:  There is no history of premature cardiovascular  disease.   SOCIAL HISTORY:  He is married.  He quit tobacco in 1994.   REVIEW OF SYSTEMS:  He has pain in the left groin associated with  ambulation.  He also has a history of arthritis and joint pain.  The  remainder of his review of systems is unremarkable and is documented on  the medical history form in his chart, as are his medications.  GENERAL:  Unremarkable.  CARDIAC:  Unremarkable.  PULMONARY:  Unremarkable.  GI:  Unremarkable.  GU:  Unremarkable.  NEURO:  Unremarkable.  PSYCHIATRIC:  Unremarkable.  ENT:  Unremarkable.  HEMATOLOGIC.  Unremarkable.   PHYSICAL EXAMINATION:  Blood pressure is 123/82, heart rate is 85.  HEENT:  There is no cervical lymphadenopathy.  Lungs are clear  bilaterally to  auscultation.  On cardiac exam, he has a regular rate and  rhythm.  Abdomen is soft and nontender.  I cannot palpate a hernia.  He  has palpable femoral pulses, and I did not detect any enlargement in the  groins to suggest aneurysm.  He has palpable femoral, popliteal, and  pedal pulses.  Extremities:  No significant lower extremity swelling.  There is no cellulitis or erythema.   He has had a Doppler study in our office today which shows that his  aortofemoral bypass graft is patent without areas of stenosis within the  graft.  ABIs are 100% bilaterally with biphasic Doppler signals in both  feet.   Of note, I could not palpate a hernia in the left groin either.  It  sounds to me that most likely, his pain is musculoskeletal in origin, as  it seems to be related to specific motions with the leg.  I have  instructed him to rest this. We also discussed potentially obtaining a  CT scan to look for a hernia that might be missed on exam; however,  currently, he is comfortable waiting on this and seeing if the pain gets  better with rest.  If his pain persists, we will proceed with CT scan of  the pelvis with IV and  p.o. contrast.  He will call if his symptoms do  not improve.   Di Kindle. Edilia Bo, M.D.  Electronically Signed  CSD/MEDQ  D:  12/24/2006  T:  12/25/2006  Job:  [id number]   cc:   Renae Fickle

## 2010-07-18 NOTE — Procedures (Signed)
VASCULAR LAB EXAM   INDICATION:  Left groin pain, status post aortobifemoral bypass graft in  1999.   HISTORY:  Diabetes:  No.  Cardiac:  No.  Hypertension:  Yes.  Smoking:  No.   EXAM:  Duplex of aortobifemoral bypass graft.   IMPRESSION:  Patent aortobifemoral bypass graft.  No evidence of local  stenosis.   ___________________________________________  Di Kindle. Edilia Bo, M.D.   MG/MEDQ  D:  12/24/2006  T:  12/25/2006  Job:  [id number]

## 2010-07-18 NOTE — Assessment & Plan Note (Signed)
OFFICE VISIT   Drew Cisneros, Drew Cisneros  DOB:  1940/03/07                                        August 17, 2009  CHART #:  [id number]   HISTORY:  The patient returned to my office today for followup of an  ascending aortic aneurysm.  I initially saw him in June 2010, at which  time CT scan showed the maximum diameter of his ascending aorta to be  about 4.0-4.1 cm.  He was referred to Dr. Peter Swaziland for cardiology  evaluation and found to have some inferior ischemia.  He subsequently  underwent cardiac catheterization showing severe right coronary disease.  An attempted angioplasty was performed, but the lesioning could not be  crossed.  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  The  procedure was stopped and he has been treated medically.  He has  subsequently undergone rotator cuff repair in March 2011.  The patient  said that he is doing fairly well overall.  He denies any chest pain or  pressure.  He has had no shortness of breath.   PHYSICAL EXAMINATION:  Today, blood pressure 158/115, pulse 73 and  regular, and respiratory rate is 18, unlabored.  Oxygen saturation on  room air is 94%.  He looks well.  Cardiac exam shows regular rate and  rhythm with normal heart sounds.  Lungs are clear.   DIAGNOSTIC TESTS:  Followup CT scan of the chest shows no change in the  size of the ascending aortic aneurysm.   IMPRESSION:  The patient has a stable 4.1-cm ascending aortic aneurysm.  I discussed the importance of good blood pressure control with him and  taking his medications as directed.  We will plan to do a followup MR  angiogram to reassess his ascending aorta in about 1 year and I  will see him back in the office after that.  He will continue to follow  up with Dr. Peter Swaziland for his coronary disease.   Evelene Croon, M.D.  Electronically Signed   BB/MEDQ  D:  08/17/2009  T:   08/18/2009  Job:  [id number]

## 2010-07-19 ENCOUNTER — Other Ambulatory Visit: Payer: Self-pay | Admitting: Oncology

## 2010-07-19 ENCOUNTER — Encounter (HOSPITAL_BASED_OUTPATIENT_CLINIC_OR_DEPARTMENT_OTHER): Payer: Medicare Other | Admitting: Oncology

## 2010-07-19 DIAGNOSIS — D693 Immune thrombocytopenic purpura: Secondary | ICD-10-CM

## 2010-07-19 DIAGNOSIS — IMO0002 Reserved for concepts with insufficient information to code with codable children: Secondary | ICD-10-CM

## 2010-07-19 LAB — CBC WITH DIFFERENTIAL/PLATELET
BASO%: 0.9 % (ref 0.0–2.0)
EOS%: 4.6 % (ref 0.0–7.0)
MCH: 28.3 pg (ref 27.2–33.4)
MCHC: 34.5 g/dL (ref 32.0–36.0)
MONO#: 0.4 10*3/uL (ref 0.1–0.9)
RBC: 4.65 10*6/uL (ref 4.20–5.82)
RDW: 14.2 % (ref 11.0–14.6)
WBC: 5.3 10*3/uL (ref 4.0–10.3)
lymph#: 0.8 10*3/uL — ABNORMAL LOW (ref 0.9–3.3)

## 2010-07-19 LAB — COMPREHENSIVE METABOLIC PANEL
ALT: 18 U/L (ref 0–53)
AST: 25 U/L (ref 0–37)
CO2: 26 mEq/L (ref 19–32)
Calcium: 9.3 mg/dL (ref 8.4–10.5)
Chloride: 105 mEq/L (ref 96–112)
Creatinine, Ser: 1.28 mg/dL (ref 0.40–1.50)
Sodium: 141 mEq/L (ref 135–145)
Total Bilirubin: 0.5 mg/dL (ref 0.3–1.2)
Total Protein: 6.5 g/dL (ref 6.0–8.3)

## 2010-07-20 ENCOUNTER — Telehealth: Payer: Self-pay | Admitting: Cardiology

## 2010-07-20 NOTE — Telephone Encounter (Signed)
Lm for Dr. Gaylyn Rong. Dr. Swaziland said he could stop Plavix.

## 2010-07-20 NOTE — Telephone Encounter (Signed)
Dr. 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 Two months ago ITP was resolved and restarted Plavix.  Drew Cisneros was in his office today and platlelets are low again. He wants to stop Plavix but wants your approval. Phone # [phone]

## 2010-07-20 NOTE — Telephone Encounter (Signed)
PHYSICIAN WANTS TO TALK TO DR Swaziland ABOUT PT'S PLAVIX AT HIS CONVIENCE. NURSE CALLED ASKING FOR CALL BACK.

## 2010-07-21 ENCOUNTER — Encounter: Payer: Self-pay | Admitting: Cardiology

## 2010-07-21 ENCOUNTER — Other Ambulatory Visit: Payer: Self-pay | Admitting: *Deleted

## 2010-07-21 DIAGNOSIS — E7800 Pure hypercholesterolemia, unspecified: Secondary | ICD-10-CM | POA: Insufficient documentation

## 2010-07-21 DIAGNOSIS — D693 Immune thrombocytopenic purpura: Secondary | ICD-10-CM | POA: Insufficient documentation

## 2010-07-21 DIAGNOSIS — I714 Abdominal aortic aneurysm, without rupture, unspecified: Secondary | ICD-10-CM | POA: Insufficient documentation

## 2010-07-21 DIAGNOSIS — R110 Nausea: Secondary | ICD-10-CM | POA: Insufficient documentation

## 2010-07-21 DIAGNOSIS — I10 Essential (primary) hypertension: Secondary | ICD-10-CM | POA: Insufficient documentation

## 2010-07-21 DIAGNOSIS — I739 Peripheral vascular disease, unspecified: Secondary | ICD-10-CM | POA: Insufficient documentation

## 2010-07-21 DIAGNOSIS — K30 Functional dyspepsia: Secondary | ICD-10-CM | POA: Insufficient documentation

## 2010-07-21 DIAGNOSIS — I7789 Other specified disorders of arteries and arterioles: Secondary | ICD-10-CM | POA: Insufficient documentation

## 2010-07-21 DIAGNOSIS — D696 Thrombocytopenia, unspecified: Secondary | ICD-10-CM | POA: Insufficient documentation

## 2010-07-21 DIAGNOSIS — I2510 Atherosclerotic heart disease of native coronary artery without angina pectoris: Secondary | ICD-10-CM | POA: Insufficient documentation

## 2010-07-21 DIAGNOSIS — I712 Thoracic aortic aneurysm, without rupture: Secondary | ICD-10-CM | POA: Insufficient documentation

## 2010-07-21 DIAGNOSIS — I723 Aneurysm of iliac artery: Secondary | ICD-10-CM | POA: Insufficient documentation

## 2010-07-24 ENCOUNTER — Other Ambulatory Visit: Payer: Self-pay | Admitting: *Deleted

## 2010-07-24 DIAGNOSIS — E7800 Pure hypercholesterolemia, unspecified: Secondary | ICD-10-CM

## 2010-07-25 ENCOUNTER — Other Ambulatory Visit (INDEPENDENT_AMBULATORY_CARE_PROVIDER_SITE_OTHER): Payer: Medicare Other | Admitting: *Deleted

## 2010-07-25 ENCOUNTER — Ambulatory Visit (INDEPENDENT_AMBULATORY_CARE_PROVIDER_SITE_OTHER): Payer: Medicare Other | Admitting: Cardiology

## 2010-07-25 ENCOUNTER — Encounter: Payer: Self-pay | Admitting: Cardiology

## 2010-07-25 ENCOUNTER — Other Ambulatory Visit (INDEPENDENT_AMBULATORY_CARE_PROVIDER_SITE_OTHER): Payer: Medicare Other | Admitting: Cardiology

## 2010-07-25 VITALS — BP 98/70 | HR 73 | Ht 68.0 in | Wt 174.4 lb

## 2010-07-25 DIAGNOSIS — E7800 Pure hypercholesterolemia, unspecified: Secondary | ICD-10-CM

## 2010-07-25 DIAGNOSIS — I712 Thoracic aortic aneurysm, without rupture: Secondary | ICD-10-CM

## 2010-07-25 DIAGNOSIS — D693 Immune thrombocytopenic purpura: Secondary | ICD-10-CM

## 2010-07-25 DIAGNOSIS — I7789 Other specified disorders of arteries and arterioles: Secondary | ICD-10-CM

## 2010-07-25 DIAGNOSIS — I739 Peripheral vascular disease, unspecified: Secondary | ICD-10-CM

## 2010-07-25 DIAGNOSIS — I2510 Atherosclerotic heart disease of native coronary artery without angina pectoris: Secondary | ICD-10-CM

## 2010-07-25 LAB — CBC WITH DIFFERENTIAL/PLATELET
Basophils Absolute: 0 10*3/uL (ref 0.0–0.1)
Eosinophils Relative: 5.1 % — ABNORMAL HIGH (ref 0.0–5.0)
HCT: 38.4 % — ABNORMAL LOW (ref 39.0–52.0)
Lymphs Abs: 0.8 10*3/uL (ref 0.7–4.0)
MCV: 83.4 fl (ref 78.0–100.0)
Monocytes Absolute: 0.5 10*3/uL (ref 0.1–1.0)
Neutrophils Relative %: 73.2 % (ref 43.0–77.0)
Platelets: 83 10*3/uL — ABNORMAL LOW (ref 150.0–400.0)
RDW: 14.6 % (ref 11.5–14.6)
WBC: 5.9 10*3/uL (ref 4.5–10.5)

## 2010-07-25 LAB — LIPID PANEL
HDL: 23.7 mg/dL — ABNORMAL LOW (ref >39.00)
Total CHOL/HDL Ratio: 5
VLDL: 96.2 mg/dL — ABNORMAL HIGH (ref 0.0–40.0)

## 2010-07-25 LAB — HEPATIC FUNCTION PANEL
AST: 25 U/L (ref 0–37)
Alkaline Phosphatase: 46 U/L (ref 39–117)
Bilirubin, Direct: 0.1 mg/dL (ref 0.0–0.3)
Total Bilirubin: 0.6 mg/dL (ref 0.3–1.2)

## 2010-07-25 LAB — LDL CHOLESTEROL, DIRECT: Direct LDL: 38.3 mg/dL

## 2010-07-25 LAB — BASIC METABOLIC PANEL
Calcium: 9.4 mg/dL (ref 8.4–10.5)
GFR: 63.09 mL/min (ref >60.00)
Potassium: 4.3 mEq/L (ref 3.5–5.1)
Sodium: 141 mEq/L (ref 135–145)

## 2010-07-25 NOTE — Assessment & Plan Note (Addendum)
Mr. Lezotte is asymptomatic at this point. Given his low platelet count I think we need to discontinue his Plavix indefinitely. He has a history of aspirin intolerance. We will continue with his beta blocker and statin therapy.

## 2010-07-25 NOTE — Assessment & Plan Note (Signed)
He has no significant claudication symptoms at this point. Continue with risk factor modification.

## 2010-07-25 NOTE — Progress Notes (Signed)
[name] Date of Birth: 01-03-1941   History of Present Illness: Mr. Wholey is seen today for followup of coronary artery disease. 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 His Plavix was discontinued for a period of time but was resumed in February. Now his platelet count has dropped again to 85,000 and we recommended stopping his Plavix. He has been doing well from a cardiac standpoint without any significant angina. He denies any shortness of breath. His blood pressure at home has been under good control particularly in the morning but tends to go a little bit at night. If he doesn't take his beta blocker he will note that his heart rate will go up to 120 beats per minute in the morning.  Current Outpatient Prescriptions on File Prior to Visit  Medication Sig Dispense Refill  . cetirizine (ZYRTEC) 10 MG tablet Take 10 mg by mouth as needed.        . doxazosin (CARDURA) 8 MG tablet Take 8 mg by mouth at bedtime.        [name] lisinopril-hydrochlorothiazide (PRINZIDE,ZESTORETIC) 10-12.5 MG per tablet Take 1 tablet by mouth daily.        . propranolol (INDERAL) 40 MG tablet Take 40 mg by mouth 3 (three) times daily.        . simvastatin (ZOCOR) 40 MG tablet Take 1 tablet by mouth Daily.      [name] DISCONTD: predniSONE (DELTASONE) 10 MG tablet Take 10 mg by mouth daily. 7 tabs daily         Allergies  Allergen Reactions  . Aspirin   . Codeine   . Compazine   . Lovaza (Omega-3-Acid Ethyl Esters)   . Penicillins   . Tricor   . Zetia (Ezetimibe)     Past Medical History  Diagnosis Date  . ITP (idiopathic thrombocytopenic purpura)   . Thrombocytopenia   . Nausea   . Indigestion   . Coronary artery disease   . PAD (peripheral artery disease)   . AAA (abdominal aortic aneurysm)   . Iliac aneurysm   . Hypercholesterolemia   . Hypertension   . Thoracic aortic aneurysm     Past Surgical History  Procedure Date  . Cardiac  catheterization     ef 60%. NO WALL MOTION ABNORMALITIES  . Abdominal aortic aneurysm repair   . Iliac artery aneurysm repair   . Rotator cuff repair w/ distal clavicle excision   . Umbilical hernia repair   . Inguinal hernia repair   . Cholecystectomy     History  Smoking status  . Former Smoker  . Quit date: 07/21/1990  Smokeless tobacco  . Not on file    History  Alcohol Use No    Family History  Problem Relation Age of Onset  . Diabetes Mother   . Diabetes Brother   . Coronary artery disease Brother     Review of Systems: The review of systems is positive for thrombocytopenia.  He denies any significant bleeding or bruising. He denies any claudication symptoms. He has had no abdominal pain or back pain.All other systems were reviewed and are negative.  Physical Exam: BP 98/70  Pulse 73  Ht 5\' 8"  (1.727 m)  Wt 174 lb 6.4 oz (79.107 kg)  BMI 26.52 kg/m2 He is a pleasant obese white male in no acute distress. He has no JVD or bruits. Lungs are clear. Cardiac exam reveals a grade 2/6 systolic ejection murmur at  the apex without S3. Abdomen is soft and nontender. He has no masses or bruits. Pedal pulses are palpable. He has a soft right groin bruit. LABORATORY DATA: ECG demonstrates normal sinus rhythm with a normal ECG.  Assessment / Plan:

## 2010-07-25 NOTE — Assessment & Plan Note (Signed)
Patient is scheduled for followup with Dr. Laneta Simmers concerning his thoracic aortic aneurysm.

## 2010-07-25 NOTE — Patient Instructions (Signed)
Stay off Plavix.   Continue your other medications.  We will call with the results of your blood work today.  I will see you back in 6 months.

## 2010-07-25 NOTE — Assessment & Plan Note (Signed)
Treatment per Dr. Gaylyn Rong. We will discontinue Plavix indefinitely.

## 2010-07-26 ENCOUNTER — Telehealth: Payer: Self-pay | Admitting: *Deleted

## 2010-07-26 MED ORDER — SIMVASTATIN 40 MG PO TABS: 40.0000 mg | ORAL_TABLET | Freq: Every day | ORAL | Status: DC

## 2010-07-26 NOTE — Telephone Encounter (Signed)
Called again. Please call back.

## 2010-07-26 NOTE — Telephone Encounter (Signed)
Lm for him to call back for lab results

## 2010-07-26 NOTE — Telephone Encounter (Signed)
Called back and gave him lab results. He also requested Simvastatin be sent in to [facility]. Done.

## 2010-07-26 NOTE — Telephone Encounter (Signed)
Message copied by Murrell Redden on Wed Jul 26, 2010 11:17 AM ------      Message from: [place], PETER      Created: Wed Jul 26, 2010  9:32 AM       Chemistries all normal. Platelet count low at 83k with known ITP. Triglycerides are quite high with low HDL. Patient intolerant of fibrates and fish oil. Needs to lose a significant amount of weight and avoid fatty foods.

## 2010-08-02 ENCOUNTER — Encounter (HOSPITAL_BASED_OUTPATIENT_CLINIC_OR_DEPARTMENT_OTHER): Payer: Medicare Other | Admitting: Oncology

## 2010-08-02 ENCOUNTER — Other Ambulatory Visit: Payer: Self-pay | Admitting: Oncology

## 2010-08-02 DIAGNOSIS — IMO0002 Reserved for concepts with insufficient information to code with codable children: Secondary | ICD-10-CM

## 2010-08-02 DIAGNOSIS — D693 Immune thrombocytopenic purpura: Secondary | ICD-10-CM

## 2010-08-02 LAB — CBC WITH DIFFERENTIAL/PLATELET
BASO%: 0.6 % (ref 0.0–2.0)
LYMPH%: 17 % (ref 14.0–49.0)
MCHC: 34.1 g/dL (ref 32.0–36.0)
MONO#: 0.4 10*3/uL (ref 0.1–0.9)
NEUT#: 3.9 10*3/uL (ref 1.5–6.5)
Platelets: 71 10*3/uL — ABNORMAL LOW (ref 140–400)
RBC: 4.55 10*6/uL (ref 4.20–5.82)
RDW: 14.9 % — ABNORMAL HIGH (ref 11.0–14.6)
WBC: 5.5 10*3/uL (ref 4.0–10.3)
lymph#: 0.9 10*3/uL (ref 0.9–3.3)

## 2010-08-16 ENCOUNTER — Other Ambulatory Visit: Payer: Self-pay | Admitting: Oncology

## 2010-08-16 ENCOUNTER — Encounter: Payer: Medicare Other | Admitting: Hematology & Oncology

## 2010-08-16 LAB — CBC WITH DIFFERENTIAL (CANCER CENTER ONLY)
BASO#: 0 10*3/uL (ref 0.0–0.2)
HCT: 39.2 % (ref 38.7–49.9)
HGB: 13.4 g/dL (ref 13.0–17.1)
LYMPH#: 1 10*3/uL (ref 0.9–3.3)
LYMPH%: 14.2 % (ref 14.0–48.0)
MCHC: 34.2 g/dL (ref 32.0–35.9)
MCV: 81 fL — ABNORMAL LOW (ref 82–98)
MONO#: 0.6 10*3/uL (ref 0.1–0.9)
NEUT%: 72.3 % (ref 40.0–80.0)

## 2010-11-28 ENCOUNTER — Telehealth: Payer: Self-pay | Admitting: Cardiology

## 2010-11-28 NOTE — Telephone Encounter (Signed)
Pt called and wants to talk to you please call

## 2010-11-28 NOTE — Telephone Encounter (Signed)
Called stating he had some chest discomfort over the week-end off and on. Also heart rate was low so he took 1/2 tablet of Plavix and by 9:00 heart was up to 95. States he has felt fine yesterday (mon) and today. No SOB, no other sx. Advised that he should not take Plavix for heart rate control. Plavix was stopped indefinitely due to ITP. Per Dr. Sw[place]iland advised to monitor and if chest pain reoccurs to call us and will be seen. Made him an app for his 6 mo FU in Nov.

## 2010-12-01 ENCOUNTER — Telehealth: Payer: Self-pay | Admitting: Cardiology

## 2010-12-01 NOTE — Telephone Encounter (Signed)
Pt was told to call and get an appt asap with [place] if pt still having chest pains, pls call

## 2010-12-01 NOTE — Telephone Encounter (Signed)
Called stating he is still having some chest pain off and on; BP and pulse dropping at times. Last PM BP was 80/40 hr 39; c/o some dizziness at times. Per Dr. Swaziland will decrease Inderal from 40 mg TID to 20 BID. Will see Lawson Fiscal on Monday 10/1 at 2:30; advised if chest pain reoccurs to go to ER at [facility].

## 2010-12-04 ENCOUNTER — Encounter: Payer: Self-pay | Admitting: Nurse Practitioner

## 2010-12-04 ENCOUNTER — Telehealth: Payer: Self-pay | Admitting: Cardiology

## 2010-12-04 ENCOUNTER — Ambulatory Visit (INDEPENDENT_AMBULATORY_CARE_PROVIDER_SITE_OTHER): Payer: Medicare Other | Admitting: Nurse Practitioner

## 2010-12-04 DIAGNOSIS — R072 Precordial pain: Secondary | ICD-10-CM

## 2010-12-04 DIAGNOSIS — I10 Essential (primary) hypertension: Secondary | ICD-10-CM

## 2010-12-04 DIAGNOSIS — R9431 Abnormal electrocardiogram [ECG] [EKG]: Secondary | ICD-10-CM

## 2010-12-04 DIAGNOSIS — R079 Chest pain, unspecified: Secondary | ICD-10-CM

## 2010-12-04 DIAGNOSIS — IMO0001 Reserved for inherently not codable concepts without codable children: Secondary | ICD-10-CM

## 2010-12-04 DIAGNOSIS — D693 Immune thrombocytopenic purpura: Secondary | ICD-10-CM

## 2010-12-04 DIAGNOSIS — I2510 Atherosclerotic heart disease of native coronary artery without angina pectoris: Secondary | ICD-10-CM

## 2010-12-04 DIAGNOSIS — R001 Bradycardia, unspecified: Secondary | ICD-10-CM

## 2010-12-04 DIAGNOSIS — I498 Other specified cardiac arrhythmias: Secondary | ICD-10-CM

## 2010-12-04 HISTORY — DX: Abnormal electrocardiogram (ECG) (EKG): R94.31

## 2010-12-04 HISTORY — DX: Reserved for inherently not codable concepts without codable children: IMO0001

## 2010-12-04 LAB — CBC WITH DIFFERENTIAL/PLATELET
Basophils Absolute: 0 10*3/uL (ref 0.0–0.1)
Basophils Relative: 0.7 % (ref 0.0–3.0)
Eosinophils Absolute: 0.2 10*3/uL (ref 0.0–0.7)
Eosinophils Relative: 3.9 % (ref 0.0–5.0)
HCT: 41 % (ref 39.0–52.0)
Hemoglobin: 13.6 g/dL (ref 13.0–17.0)
Lymphocytes Relative: 11.6 % — ABNORMAL LOW (ref 12.0–46.0)
Lymphs Abs: 0.8 10*3/uL (ref 0.7–4.0)
MCHC: 33.3 g/dL (ref 30.0–36.0)
MCV: 85.1 fl (ref 78.0–100.0)
Monocytes Absolute: 0.5 10*3/uL (ref 0.1–1.0)
Monocytes Relative: 7.1 % (ref 3.0–12.0)
Neutro Abs: 5 10*3/uL (ref 1.4–7.7)
Neutrophils Relative %: 76.7 % (ref 43.0–77.0)
Platelets: 86 10*3/uL — ABNORMAL LOW (ref 150.0–400.0)
RBC: 4.81 Mil/uL (ref 4.22–5.81)
RDW: 14.5 % (ref 11.5–14.6)
WBC: 6.5 10*3/uL (ref 4.5–10.5)

## 2010-12-04 LAB — BASIC METABOLIC PANEL
BUN: 21 mg/dL (ref 6–23)
CO2: 28 mEq/L (ref 19–32)
Calcium: 9.2 mg/dL (ref 8.4–10.5)
Chloride: 103 mEq/L (ref 96–112)
Creatinine, Ser: 1.4 mg/dL (ref 0.4–1.5)
GFR: 51.56 mL/min — ABNORMAL LOW (ref >60.00)
Glucose, Bld: 96 mg/dL (ref 70–99)
Potassium: 4 mEq/L (ref 3.5–5.1)
Sodium: 140 mEq/L (ref 135–145)

## 2010-12-04 MED ORDER — PROPRANOLOL HCL 40 MG PO TABS: 20.0000 mg | ORAL_TABLET | Freq: Three times a day (TID) | ORAL | Status: DC | PRN

## 2010-12-04 NOTE — Assessment & Plan Note (Signed)
His rate is currently ok. He thinks his bradycardia that he has noted is worse when taking the Zocor. I don't think that is the culprit. We will place a 24 hour Holter to further discern. We have agreed to change his Inderal to just 20 mg TID prn.

## 2010-12-04 NOTE — Progress Notes (Signed)
Drew Cisneros Date of Birth: 05/11/1940   History of Present Illness: Drew Cisneros is seen today for a work in visit. He is seen for Drew Cisneros. He has not felt well for the past 2 weeks. He says his blood pressure has been labile with readings from 80 to 140 systolic. Heart rate has been variable and noted to go to the 30's. He has had some recurrent chest pain. He is not active and does not really exert himself due to arthritis. Chest pain is somewhat similar to the discomfort he had when he had his cath 2 years ago. He notes that his heart rate goes down when taking Zocor and Cardura. When it goes down, he feels like he will pass out. No frank syncope noted. He has cut the Inderal back per Drew Cisneros recommendation but then notes tachycardia. He is very frustrated. He also does not understand why he is no longer taking Plavix. He says he is not seeing hematology anymore. He has a history of ITP. He is currently not on treatment and has had no recent labs. He brings in an ultrasound report from Drew Cisneros showing a 4.0 cm AAA. He has had prior AAA repair. He is due to follow up with Drew Cisneros soon. No shortness of breath.   Current Outpatient Prescriptions on File Prior to Visit  Medication Sig Dispense Refill  . cetirizine (ZYRTEC) 10 MG tablet Take 10 mg by mouth as needed.        . doxazosin (CARDURA) 8 MG tablet Take 8 mg by mouth at bedtime.        [name] lisinopril-hydrochlorothiazide (PRINZIDE,ZESTORETIC) 10-12.5 MG per tablet Take 1 tablet by mouth daily.        . simvastatin (ZOCOR) 40 MG tablet Take 1 tablet (40 mg total) by mouth at bedtime.  90 tablet  3  . DISCONTD: propranolol (INDERAL) 40 MG tablet Take 40 mg by mouth 3 (three) times daily.          Allergies  Allergen Reactions  . Aspirin   . Codeine   . Compazine   . Lovaza (Omega-3-Acid Ethyl Esters)   . Penicillins   . Tricor   . Zetia (Ezetimibe)     Past Medical History  Diagnosis Date  . ITP  (idiopathic thrombocytopenic purpura)   . Thrombocytopenia   . Nausea   . Indigestion   . Coronary artery disease     has known single vessel disease. Unsuccessful PCI to RCA in 2010. Left systsem with moderate calcifications but no obstructive disease and left to right collaterals.   [name] PAD (peripheral artery disease)     followed by Drew Cisneros  . AAA (abdominal aortic aneurysm)     S/P AAA repair. Infra renal AAA measures 4.0 cm in August of 2012  . Iliac aneurysm     S/P bilateral iliac repair  . Hypercholesterolemia   . Hypertension   . Thoracic aortic aneurysm     followed by Drew Cisneros  . Renal artery stenosis     Left per cardiac cath in 2010    Past Surgical History  Procedure Date  . Cardiac catheterization 2010    EF 60% with single vessel CAD. He underwent attempt at PCI  with dissection and had successful stenting of the prox RCA but unsuccessful PCI to the mid RCA. Mild to moderate disease in the left system noted.   . Abdominal aortic aneurysm repair   . Iliac artery aneurysm  repair   . Rotator cuff repair w/ distal clavicle excision   . Umbilical hernia repair   . Inguinal hernia repair   . Cholecystectomy     History  Smoking status  . Former Smoker  . Quit date: 07/21/1990  Smokeless tobacco  . Not on file    History  Alcohol Use No    Family History  Problem Relation Age of Onset  . Diabetes Mother   . Diabetes Brother   . Coronary artery disease Brother     Review of Systems: The review of systems is per the HPI. He also has arthritis.  All other systems were reviewed and are negative.  Physical Exam: BP 104/78  Pulse 73  Ht 5\' 8"  (1.727 m)  Wt 162 lb (73.483 kg)  BMI 24.63 kg/m2 Patient is alert and in no acute distress. Skin is warm and dry. Color is normal.  HEENT is unremarkable. Normocephalic/atraumatic. PERRL. Sclera are nonicteric. Neck is supple. No masses. No JVD. Lungs are clear. Cardiac exam shows a regular rate and rhythm.  Abdomen is soft. Extremities are without edema. Gait and ROM are intact. No gross neurologic deficits noted.  LABORATORY DATA: EKG today shows sinus rhythm. Rate is in the 70's. Tracing is unchanged.    Assessment / Plan:

## 2010-12-04 NOTE — Telephone Encounter (Signed)
Called w/questions about stress test. Wanted to be sure the dye wasn't going thru kidneys. Assured that this was not a dye and would not go thru kidneys.

## 2010-12-04 NOTE — Assessment & Plan Note (Signed)
Blood pressure is ok today. He reports some lability in his readings from home. We will see what the holter and Lexiscan show. May need to consider relooking at his renal arteries in the future.

## 2010-12-04 NOTE — Patient Instructions (Addendum)
We are going to put a monitor on to look at your heart rate for the next 24 hours We are going to get a stress test to evaluate your chest pain We are going to check some labs today We will see you back after your tests are completed  Cut the Inderal back to just a half 3 times a day as needed. Stay on your other medicines.

## 2010-12-04 NOTE — Telephone Encounter (Signed)
Pt has questions about stress test

## 2010-12-04 NOTE — Assessment & Plan Note (Signed)
He has had the return of chest discomfort. We will refer for repeat stress testing. He does remain off of his Plavix due to his ITP and low platelets. Will check CBC today. We will see again in 2 weeks to discuss his studies. Further disposition to follow. Patient is agreeable to this plan and will call if any problems develop in the interim.

## 2010-12-05 ENCOUNTER — Telehealth: Payer: Self-pay | Admitting: Cardiology

## 2010-12-05 NOTE — Telephone Encounter (Signed)
Pt has questions about stress test and monitor please call

## 2010-12-05 NOTE — Telephone Encounter (Signed)
Called wanting to know how late he could take his Inderal tonight before stress test tomorrow. Checked w/Nuclear and was told by [facility] that he can take during night if HR goes up but needs to bring medication with him. Advised wife and he will bring meds.

## 2010-12-06 ENCOUNTER — Telehealth: Payer: Self-pay | Admitting: *Deleted

## 2010-12-06 ENCOUNTER — Encounter: Payer: Self-pay | Admitting: *Deleted

## 2010-12-06 ENCOUNTER — Ambulatory Visit (HOSPITAL_COMMUNITY): Payer: Medicare Other | Attending: Cardiology | Admitting: Radiology

## 2010-12-06 DIAGNOSIS — I498 Other specified cardiac arrhythmias: Secondary | ICD-10-CM | POA: Insufficient documentation

## 2010-12-06 DIAGNOSIS — I2510 Atherosclerotic heart disease of native coronary artery without angina pectoris: Secondary | ICD-10-CM

## 2010-12-06 DIAGNOSIS — R001 Bradycardia, unspecified: Secondary | ICD-10-CM

## 2010-12-06 DIAGNOSIS — R079 Chest pain, unspecified: Secondary | ICD-10-CM | POA: Insufficient documentation

## 2010-12-06 DIAGNOSIS — R55 Syncope and collapse: Secondary | ICD-10-CM

## 2010-12-06 MED ORDER — TECHNETIUM TC 99M TETROFOSMIN IV KIT
11.0000 | PACK | Freq: Once | INTRAVENOUS | Status: AC | PRN
  Administered 2010-12-06: 11 via INTRAVENOUS

## 2010-12-06 MED ORDER — TECHNETIUM TC 99M TETROFOSMIN IV KIT
33.0000 | PACK | Freq: Once | INTRAVENOUS | Status: AC | PRN
  Administered 2010-12-06: 33 via INTRAVENOUS

## 2010-12-06 MED ORDER — REGADENOSON 0.4 MG/5ML IV SOLN
0.4000 mg | Freq: Once | INTRAVENOUS | Status: AC
  Administered 2010-12-06: 0.4 mg via INTRAVENOUS

## 2010-12-06 NOTE — Telephone Encounter (Signed)
Message copied by Lorayne Bender on Wed Dec 06, 2010  5:42 PM ------      Message from: Rosalio Macadamia      Created: Tue Dec 05, 2010  7:46 AM       Ok to report. Labs are satisfactory. Platelets remain low due to ITP. Will see what the holter and stress test shows.

## 2010-12-06 NOTE — Progress Notes (Signed)
[facility] SITE 3 NUCLEAR MED [address] [place] [address] [phone]  Cardiology Nuclear Med Study  Drew Cisneros is a 70 y.o. male [id number] 07/20/1940   Nuclear Med Background Indication for Stress Test:  Evaluation for Ischemia History: 06/10 Echo: EF 55-60% mod. LVH, '10 Heart Catheterization:EF 60% single vessel dz unable to stent RCA, 08/16/08 Myocardial Perfusion Study: abn inferior ischemia and AAA repair Cardiac Risk Factors: Family History - CAD, History of Smoking, Hypertension, Lipids and PVD  Symptoms:  Chest Pain, Near Syncope and Rapid HR   Nuclear Pre-Procedure Caffeine/Decaff Intake:  None NPO After: 8:00pm   Lungs:  clear IV 0.9% NS with Angio Cath:  20g  IV Site: R Wrist  X 1, tolerated IV Started by:  Irean Hong, RN  Chest Size (in):  42 Cup Size: n/a  Height: 5\' 8"  (1.727 m)  Weight:  158 lb (71.668 kg)  BMI:  Body mass index is 24.02 kg/(m^2). Tech Comments:  Last propranolol 12:00 mn    Nuclear Med Study 1 or 2 day study: 1 day  Stress Test Type:  Lexiscan  Reading MD: Kristeen Miss, MD  Order Authorizing Provider:  Peter [place], MD  Resting Radionuclide: Technetium 81m Tetrofosmin  Resting Radionuclide Dose: 11.0 mCi   Stress Radionuclide:  Technetium 37m Tetrofosmin  Stress Radionuclide Dose: 33.0 mCi           Stress Protocol Rest HR: 71 Stress HR: 96  Rest BP: 88/68 Stress BP: 126/67  Exercise Time (min): n/a METS: n/a   Predicted Max HR: 151 bpm % Max HR: 63.58 bpm Rate Pressure Product: 21308   Dose of Adenosine (mg):  n/a Dose of Lexiscan: 0.4 mg  Dose of Atropine (mg): n/a Dose of Dobutamine: n/a mcg/kg/min (at max HR)  Stress Test Technologist: Milana Na, EMT-P  Nuclear Technologist:  Domenic Polite, CNMT     Rest Procedure:  Myocardial perfusion imaging was performed at rest 45 minutes following the intravenous administration of Technetium 76m Tetrofosmin. Rest ECG: NSR  Stress  Procedure:  The patient received IV Lexiscan 0.4 mg over 15-seconds.  Technetium 76m Tetrofosmin injected at 30-seconds.  The patient had sob, Lt. Headed, flushed, and (R) sided chest pain with Lexiscan. There were no significant changes with Lexiscan.  Quantitative spect images were obtained after a 45 minute delay. Stress ECG: No significant change from baseline ECG  QPS Raw Data Images:  There is interference from nuclear activity from structures below the diaphragm. This does not affect the ability to read the study. Stress Images:  There is significant uptake in the bowel which attenuates the signal in the inferior wall.  The uptake in the other regions is normal. Rest Images:  There is significant uptake in the bowel which attenuates the signal in the inferior wall.  The uptake in the other regions is normal Subtraction (SDS):  No evidence of ischemia. Transient Ischemic Dilatation (Normal <1.22):  0.97 Lung/Heart Ratio (Normal <0.45):  0.29  Quantitative Gated Spect Images QGS EDV:  71 ml QGS ESV:  20 ml QGS cine images:  NL LV Function; NL Wall Motion QGS EF: 72%  Impression Exercise Capacity:  Lexiscan with no exercise. BP Response:  Normal blood pressure response. Clinical Symptoms:  No chest pain. ECG Impression:  No significant ST segment change suggestive of ischemia. Comparison with Prior Nuclear Study: No images to compare  Overall Impression:  Low risk stress nuclear study.  There is no evidence of ischemia.  There is attenuation of the inferior wall due to significant uptake in the bowel below the diaphragm.  The LV function is normal with an EF of 72%.    Vesta Mixer, Montez Hageman., MD, [facility]

## 2010-12-06 NOTE — Telephone Encounter (Signed)
Spoke w/wife. Advised of lab results. Will call when have results of holter and stress test.

## 2010-12-07 ENCOUNTER — Telehealth: Payer: Self-pay | Admitting: *Deleted

## 2010-12-07 ENCOUNTER — Telehealth: Payer: Self-pay | Admitting: Cardiology

## 2010-12-07 NOTE — Telephone Encounter (Signed)
Pt wants to know about stress test please call

## 2010-12-07 NOTE — Telephone Encounter (Signed)
Message copied by Lorayne Bender on Thu Dec 07, 2010  2:05 PM ------      Message from: Sw[place], PETER M      Created: Thu Dec 07, 2010  9:35 AM       Myoview study looks good. Normal EF. Please report.      Theron Arista [place]

## 2010-12-07 NOTE — Telephone Encounter (Signed)
Notified of wife of stress test results. Will call when have holter monitor results.

## 2010-12-11 ENCOUNTER — Telehealth: Payer: Self-pay | Admitting: *Deleted

## 2010-12-11 NOTE — Telephone Encounter (Signed)
Advised that he is on Lori's schedule but she will come and get Dr. Swaziland while he is here so Dr. Swaziland will see also.

## 2010-12-11 NOTE — Telephone Encounter (Signed)
Pt calling wanting to double check appt this Wednesday pt will see BOTH Dr. Swaziland and Norma Fredrickson. Please return pt call to discuss further.

## 2010-12-11 NOTE — Telephone Encounter (Signed)
Notified wife of holter monitor results. To see Lawson Fiscal and Dr. Swaziland on Wed

## 2010-12-13 ENCOUNTER — Encounter: Payer: Self-pay | Admitting: Nurse Practitioner

## 2010-12-13 ENCOUNTER — Ambulatory Visit (INDEPENDENT_AMBULATORY_CARE_PROVIDER_SITE_OTHER): Payer: Medicare Other | Admitting: Nurse Practitioner

## 2010-12-13 DIAGNOSIS — I2510 Atherosclerotic heart disease of native coronary artery without angina pectoris: Secondary | ICD-10-CM

## 2010-12-13 DIAGNOSIS — I498 Other specified cardiac arrhythmias: Secondary | ICD-10-CM

## 2010-12-13 DIAGNOSIS — R001 Bradycardia, unspecified: Secondary | ICD-10-CM

## 2010-12-13 DIAGNOSIS — I10 Essential (primary) hypertension: Secondary | ICD-10-CM

## 2010-12-13 NOTE — Patient Instructions (Signed)
We would advise you to get a new blood pressure cuff - Omron is suggested  Let us know if your symptoms persist.   Discuss with Dr. Laymond Purser your testosterone management  We will see you back in 3 months

## 2010-12-13 NOTE — Assessment & Plan Note (Signed)
His Myoview is ok. He is felt to be stable from our standpoint. We will see him back in 3 months. He needs to see his PCP regarding his testosterone therapy. Patient is agreeable to this plan and will call if any problems develop in the interim.

## 2010-12-13 NOTE — Progress Notes (Signed)
Drew Cisneros Date of Birth: 05-29-1940   History of Present Illness: Drew Cisneros is seen today for a follow up visit. He is subsequently seen with Dr. Swaziland. He has had some atypical chest pain and variability in his heart rate. He has had a basically negative Holter and his myoview is normal. He says he still feels weak. His voice is hoarse. He still notes bradycardia especially on the day he wore the monitor. He is using a wrist cuff. He is walking some and has no exertional symptoms whatsoever. He does not walk routinely. He wants to get on therapy for his low testosterone levels.   Current Outpatient Prescriptions on File Prior to Visit  Medication Sig Dispense Refill  . cetirizine (ZYRTEC) 10 MG tablet Take 10 mg by mouth as needed.        . doxazosin (CARDURA) 8 MG tablet Take 8 mg by mouth at bedtime.        [name] lisinopril-hydrochlorothiazide (PRINZIDE,ZESTORETIC) 10-12.5 MG per tablet Take 1 tablet by mouth daily.        . propranolol (INDERAL) 40 MG tablet Take 0.5 tablets (20 mg total) by mouth 3 (three) times daily as needed.      . simvastatin (ZOCOR) 40 MG tablet Take 1 tablet (40 mg total) by mouth at bedtime.  90 tablet  3    Allergies  Allergen Reactions  . Aspirin   . Codeine   . Compazine   . Lovaza (Omega-3-Acid Ethyl Esters)   . Penicillins   . Tricor   . Zetia (Ezetimibe)     Past Medical History  Diagnosis Date  . ITP (idiopathic thrombocytopenic purpura)   . Thrombocytopenia   . Nausea   . Indigestion   . Coronary artery disease     has known single vessel disease. Unsuccessful PCI to RCA in 2010. Left systsem with moderate calcifications but no obstructive disease and left to right collaterals.   [name] PAD (peripheral artery disease)     followed by Dr. Edilia Bo  . AAA (abdominal aortic aneurysm)     S/P AAA repair. Infra renal AAA measures 4.0 cm in August of 2012  . Iliac aneurysm     S/P bilateral iliac repair  . Hypercholesterolemia   .  Hypertension   . Thoracic aortic aneurysm     followed by Dr. Laneta Simmers  . Renal artery stenosis     Left per cardiac cath in 2010    Past Surgical History  Procedure Date  . Cardiac catheterization 2010    EF 60% with single vessel CAD. He underwent attempt at PCI  with dissection and had successful stenting of the prox RCA but unsuccessful PCI to the mid RCA. Mild to moderate disease in the left system noted.   . Abdominal aortic aneurysm repair   . Iliac artery aneurysm repair   . Rotator cuff repair w/ distal clavicle excision   . Umbilical hernia repair   . Inguinal hernia repair   . Cholecystectomy     History  Smoking status  . Former Smoker  . Quit date: 07/21/1990  Smokeless tobacco  . Not on file    History  Alcohol Use No    Family History  Problem Relation Age of Onset  . Diabetes Mother   . Diabetes Brother   . Coronary artery disease Brother     Review of Systems: The review of systems is per the HPI.  All other systems were reviewed and are negative.  Physical Exam: BP 86/60  Pulse 70  Ht 5\' 8"  (1.727 m)  Wt 160 lb 1.9 oz (72.63 kg)  BMI 24.35 kg/m2 Repeat blood pressure by me is 102/70. Patient is alert and in no acute distress. His affect is a little flat to me. Skin is warm and dry. Color is normal.  HEENT is unremarkable. Normocephalic/atraumatic. PERRL. Sclera are nonicteric. Neck is supple. No masses. No JVD. Lungs are clear. Cardiac exam shows a regular rate and rhythm. Abdomen is soft. Extremities are without edema. Gait and ROM are intact. No gross neurologic deficits noted.  LABORATORY DATA: Holter shows no pauses and only rare PAC's and PVC's. His Myoview shows a normal EF and no ischemia.    Assessment / Plan:

## 2010-12-13 NOTE — Assessment & Plan Note (Signed)
Blood pressure is ok by me. He will obtain a new blood pressure cuff.

## 2010-12-13 NOTE — Assessment & Plan Note (Signed)
His Holter is ok. Just some rare PAC's and PVC's. No pauses. He is encouraged to get an arm cuff and not use his wrist cuff.

## 2010-12-15 ENCOUNTER — Ambulatory Visit: Payer: Medicare Other | Admitting: Nurse Practitioner

## 2010-12-27 ENCOUNTER — Telehealth (HOSPITAL_COMMUNITY): Payer: Self-pay | Admitting: Cardiology

## 2010-12-27 NOTE — Telephone Encounter (Signed)
Note was to be sent to PCP stating it was ok for him to have something to increase his testerone.  He was there today and they have not received.  Please call patient and let him know.

## 2010-12-27 NOTE — Telephone Encounter (Signed)
Called stating he saw Dr. Laymond Purser today and he did not have our office note stating it was OK to increase his Testosterone. Advised letter was sent. Will send again.

## 2011-01-16 ENCOUNTER — Encounter: Payer: Self-pay | Admitting: Nurse Practitioner

## 2011-01-22 ENCOUNTER — Ambulatory Visit: Payer: Medicare Other | Admitting: Cardiology

## 2011-03-12 ENCOUNTER — Encounter: Payer: Self-pay | Admitting: Cardiology

## 2011-06-12 ENCOUNTER — Other Ambulatory Visit: Payer: Self-pay | Admitting: Surgery

## 2011-06-12 DIAGNOSIS — I7101 Dissection of thoracic aorta: Secondary | ICD-10-CM

## 2011-06-12 DIAGNOSIS — I712 Thoracic aortic aneurysm, without rupture: Secondary | ICD-10-CM

## 2011-06-29 ENCOUNTER — Other Ambulatory Visit: Payer: Medicare Other

## 2011-07-05 ENCOUNTER — Other Ambulatory Visit: Payer: Self-pay | Admitting: Surgery

## 2011-07-05 DIAGNOSIS — I712 Thoracic aortic aneurysm, without rupture: Secondary | ICD-10-CM

## 2011-07-10 ENCOUNTER — Ambulatory Visit
Admission: RE | Admit: 2011-07-10 | Discharge: 2011-07-10 | Disposition: A | Discharge status: Home / Self Care | Payer: Medicare Other | Source: Ambulatory Visit | Attending: Surgery | Admitting: Surgery

## 2011-07-10 ENCOUNTER — Ambulatory Visit (INDEPENDENT_AMBULATORY_CARE_PROVIDER_SITE_OTHER): Payer: Medicare Other | Admitting: Surgery

## 2011-07-10 ENCOUNTER — Encounter: Payer: Self-pay | Admitting: Surgery

## 2011-07-10 VITALS — BP 106/74 | HR 76 | Resp 16 | Ht 68.0 in | Wt 160.0 lb

## 2011-07-10 DIAGNOSIS — I712 Thoracic aortic aneurysm, without rupture: Secondary | ICD-10-CM

## 2011-07-11 ENCOUNTER — Encounter: Payer: Self-pay | Admitting: Surgery

## 2011-07-11 NOTE — Progress Notes (Signed)
                   [address]            [place] [address]          [phone]      HPI:  The patient returned today for followup of an ascending aortic aneurysm. I initially saw in June 2010 at which time CT scan showed the maximum diameter of his ascending aorta to be about 4.0-4.1 cm. I saw him again on 08/16/2009 and a CT scan that time showed no change in the size of the aneurysm. Since I last saw him he said that he has been feeling fairly well overall. He denies any chest or back pain. He said he has had some kidney dysfunction and therefore the scan was done without contrast.  Current Outpatient Prescriptions  Medication Sig Dispense Refill  . cetirizine (ZYRTEC) 10 MG tablet Take 10 mg by mouth as needed.        . doxazosin (CARDURA) 8 MG tablet Take 8 mg by mouth at bedtime.        [name] lisinopril-hydrochlorothiazide (PRINZIDE,ZESTORETIC) 10-12.5 MG per tablet Take 1 tablet by mouth daily.        . propranolol (INDERAL) 40 MG tablet Take 0.5 tablets (20 mg total) by mouth 3 (three) times daily as needed.      . simvastatin (ZOCOR) 40 MG tablet Take 1 tablet (40 mg total) by mouth at bedtime.  90 tablet  3      Physical Exam:  BP 106/74  Pulse 76  Resp 16  Ht 5\' 8"  (1.727 m)  Wt 160 lb (72.576 kg)  BMI 24.33 kg/m2  SpO2 96%  He looks well. Cardiac exam has a regular rate and rhythm with normal S1 and S2. There is no murmur, rub, or gallop. Lung exam is clear.  Diagnostic Tests:   CT CHEST WITHOUT CONTRAST  Technique: Multidetector CT imaging of the chest was performed  following the standard protocol without IV contrast.  Comparison: CT angio chest of 08/16/2009  Findings: On the lung window images, changes of both paraseptal and  centrilobular emphysema are noted. There is no lung infiltrate and  no effusion is seen. No suspicious lung nodule is noted. The  airway is patent.  On soft tissue window images, the thyroid gland is unremarkable.    The ascending aorta measured at approximately the same level as on  the prior study measures 3.9 x 3.9 cm compared to 4.0 x 3.9 cm  previously. Atheromatous change is noted within the aortic arch  and the descending thoracic aorta. No focal thoracic aortic  aneurysm is seen. Coronary artery calcifications are noted  primarily in the distribution of the left anterior descending. No  mediastinal or hilar adenopathy is seen. There are degenerative  changes throughout the thoracic spine.  IMPRESSION:  1. Stable fusiform dilatation of the ascending aorta with maximum  diameter of 4.0 x 3.9 cm.  2. Emphysema.  Original Report Authenticated By: Juline Patch, M.D.  Impression:  The patient has a stable 4.0 cm ascending aortic aneurysm.This does not require any treatment at this time. Since this has been stable since 2010 I recommended that we repeat his CT scan the chest without contrast in 2 years.  Plan:  I will see him back in 2 years with a CT scan the chest without contrast.

## 2011-10-04 ENCOUNTER — Ambulatory Visit (INDEPENDENT_AMBULATORY_CARE_PROVIDER_SITE_OTHER): Payer: Medicare Other | Admitting: Cardiology

## 2011-10-04 ENCOUNTER — Encounter: Payer: Self-pay | Admitting: Cardiology

## 2011-10-04 VITALS — BP 96/62 | HR 82 | Ht 68.0 in | Wt 165.0 lb

## 2011-10-04 DIAGNOSIS — I739 Peripheral vascular disease, unspecified: Secondary | ICD-10-CM

## 2011-10-04 DIAGNOSIS — I712 Thoracic aortic aneurysm, without rupture: Secondary | ICD-10-CM

## 2011-10-04 DIAGNOSIS — I2510 Atherosclerotic heart disease of native coronary artery without angina pectoris: Secondary | ICD-10-CM

## 2011-10-04 DIAGNOSIS — IMO0001 Reserved for inherently not codable concepts without codable children: Secondary | ICD-10-CM

## 2011-10-04 DIAGNOSIS — I10 Essential (primary) hypertension: Secondary | ICD-10-CM

## 2011-10-04 MED ORDER — SIMVASTATIN 40 MG PO TABS: 40.0000 mg | ORAL_TABLET | Freq: Every day | ORAL | Status: DC

## 2011-10-04 NOTE — Patient Instructions (Signed)
Continue your current therapy  I will see you again in 1 year.   

## 2011-10-04 NOTE — Assessment & Plan Note (Signed)
No significant symptoms of claudication.

## 2011-10-04 NOTE — Progress Notes (Signed)
[name] Date of Birth: 1940-04-30   History of Present Illness: Drew Cisneros is seen today for a follow up visit. He reports he is doing very well from a cardiac standpoint. He has been walking on a treadmill or riding an exercise bike regularly. He denies any significant chest pain or shortness of breath. He previously had some atypical chest pain last year and a nuclear stress test was unremarkable. Since he has been back on his Prilosec he has had no further chest pain. He also had a followup noncontrast CT the shear which showed no change in his thoracic aorta measuring approximately 4 cm. He denies any claudication symptoms.  Current Outpatient Prescriptions on File Prior to Visit  Medication Sig Dispense Refill  . cetirizine (ZYRTEC) 10 MG tablet Take 10 mg by mouth as needed.        . doxazosin (CARDURA) 8 MG tablet Take 8 mg by mouth at bedtime.        [name] lisinopril-hydrochlorothiazide (PRINZIDE,ZESTORETIC) 10-12.5 MG per tablet Take 1 tablet by mouth daily.        [name] omeprazole (PRILOSEC) 20 MG capsule Take 20 mg by mouth daily.      [name] DISCONTD: propranolol (INDERAL) 40 MG tablet Take 0.5 tablets (20 mg total) by mouth 3 (three) times daily as needed.      [name] DISCONTD: simvastatin (ZOCOR) 40 MG tablet Take 1 tablet (40 mg total) by mouth at bedtime.  90 tablet  3    Allergies  Allergen Reactions  . Aspirin   . Codeine   . Compazine   . Fenofibrate   . Lovaza (Omega-3-Acid Ethyl Esters)   . Penicillins   . Zetia (Ezetimibe)     Past Medical History  Diagnosis Date  . ITP (idiopathic thrombocytopenic purpura)   . Thrombocytopenia   . Nausea   . Indigestion   . Coronary artery disease     has known single vessel disease. Unsuccessful PCI to RCA in 2010. Left systsem with moderate calcifications but no obstructive disease and left to right collaterals.   [name] PAD (peripheral artery disease)     followed by Dr. Edilia Bo  . AAA (abdominal aortic aneurysm)     S/P AAA  repair. Infra renal AAA measures 4.0 cm in August of 2012  . Iliac aneurysm     S/P bilateral iliac repair  . Hypercholesterolemia   . Hypertension   . Thoracic aortic aneurysm     followed by Dr. Laneta Simmers  . Renal artery stenosis     Left per cardiac cath in 2010  . Normal nuclear stress test October 2012    EF 72% with no ischemia  . Holter monitor, abnormal October 2012    with only rare PVC and PAC, NO pauses, No significant bradycardia    Past Surgical History  Procedure Date  . Cardiac catheterization 2010    EF 60% with single vessel CAD. He underwent attempt at PCI  with dissection and had successful stenting of the prox RCA but unsuccessful PCI to the mid RCA. Mild to moderate disease in the left system noted.   . Abdominal aortic aneurysm repair   . Iliac artery aneurysm repair   . Rotator cuff repair w/ distal clavicle excision   . Umbilical hernia repair   . Inguinal hernia repair   . Cholecystectomy     History  Smoking status  . Former Smoker  . Quit date: 07/21/1990  Smokeless tobacco  . Not on file  History  Alcohol Use No    Family History  Problem Relation Age of Onset  . Diabetes Mother   . Diabetes Brother   . Coronary artery disease Brother     Review of Systems: The review of systems is per the HPI.  All other systems were reviewed and are negative.  Physical Exam: BP 96/62  Pulse 82  Ht 5\' 8"  (1.727 m)  Wt 74.844 kg (165 lb)  BMI 25.09 kg/m2  SpO2 94%  Patient is alert and in no acute distress. Mood is appropriate. Skin is warm and dry. Color is normal.  HEENT is unremarkable. Normocephalic/atraumatic. PERRL. Sclera are nonicteric. Neck is supple. No masses. No JVD. Lungs are clear. Cardiac exam shows a regular rate and rhythm. Abdomen is soft. Extremities are without edema. Gait and ROM are intact. No gross neurologic deficits noted.  LABORATORY DATA:    Assessment / Plan:

## 2011-10-04 NOTE — Assessment & Plan Note (Signed)
Stable by noncontrast CT. Dr. Laneta Simmers recommended followup in 2 years.

## 2011-10-04 NOTE — Assessment & Plan Note (Signed)
Stress Myoview study in October of 2012 showed no significant ischemia. Who remains asymptomatic. We will continue with risk factor modification including statin therapy and blood pressure control.

## 2012-03-26 ENCOUNTER — Telehealth: Payer: Self-pay

## 2012-03-26 NOTE — Telephone Encounter (Signed)
Dr.Jordan cleared patient for upcoming surgery.Clearance letter faxed to [facility] fax # [phone].

## 2012-05-23 IMAGING — CT CT CHEST W/O CM
3 of 4 series · 16 of 30 positions shown, 17 images · non-contrast
Comparison: CT angio chest of 08/16/2009

CLINICAL DATA: Ascending aortic aneurysm, follow-up

CT CHEST WITHOUT CONTRAST
TECHNIQUE: Multidetector CT imaging of the chest was performed
following the standard protocol without IV contrast.

[Series 3: chest w/o · axial · non-contrast · 0.70mm/px · z∈[-195,-25]mm · 4 of 58 slices shown, 5 images]
[im 12/58  mediastinal]
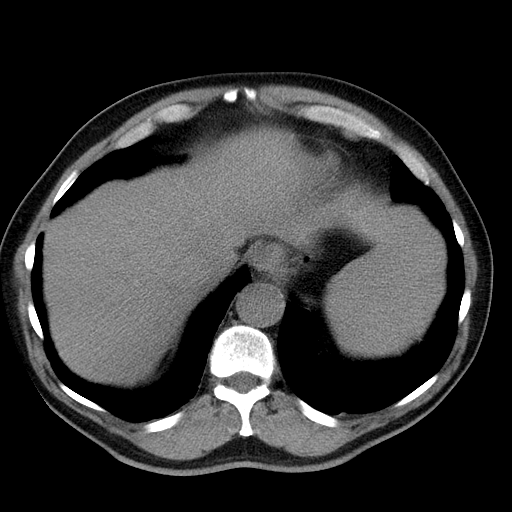
[im 12/58  lung]
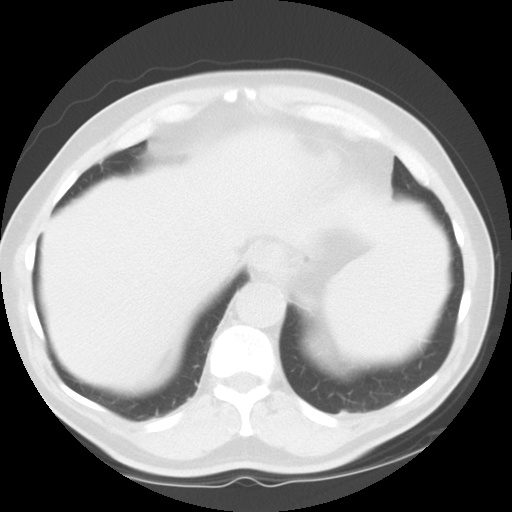
[im 23/58  lung]
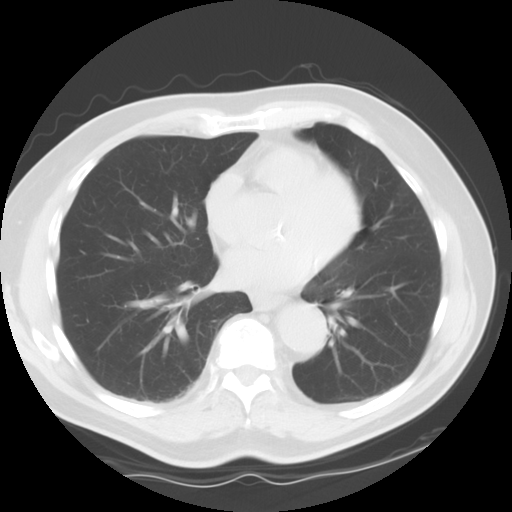
[im 35/58  lung]
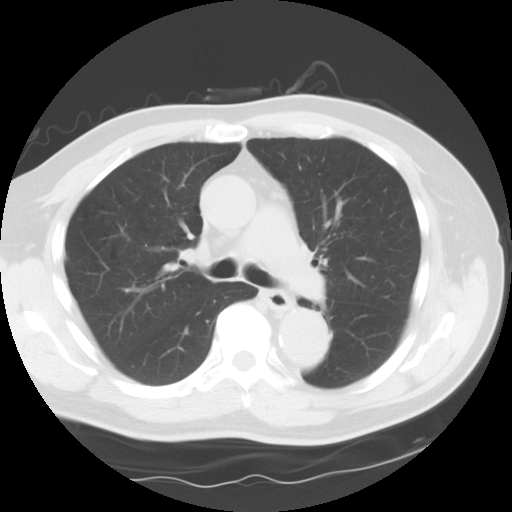
[im 46/58  lung]
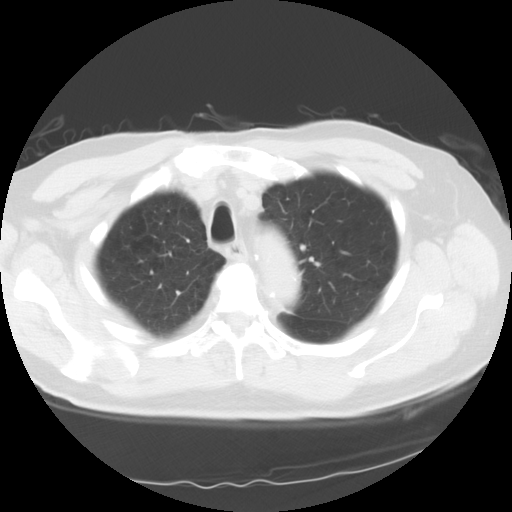

[Series 4: lung windows · axial · 0.70mm/px · z∈[-195,-25]mm · 4 of 58 slices shown]
[im 12/58  lung]
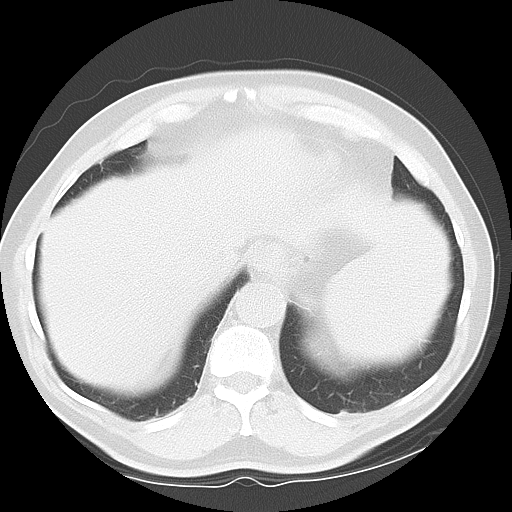
[im 23/58  lung]
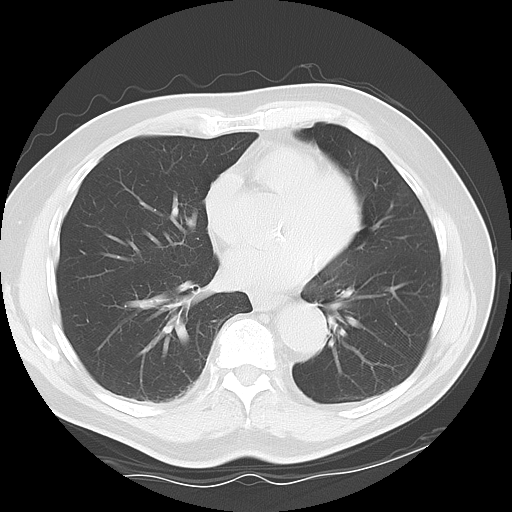
[im 35/58  lung]
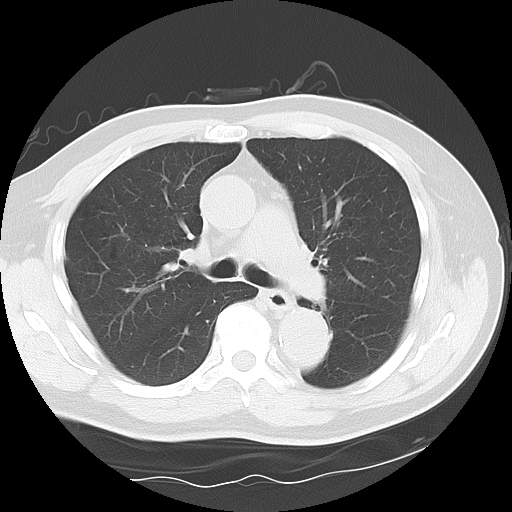
[im 46/58  lung]
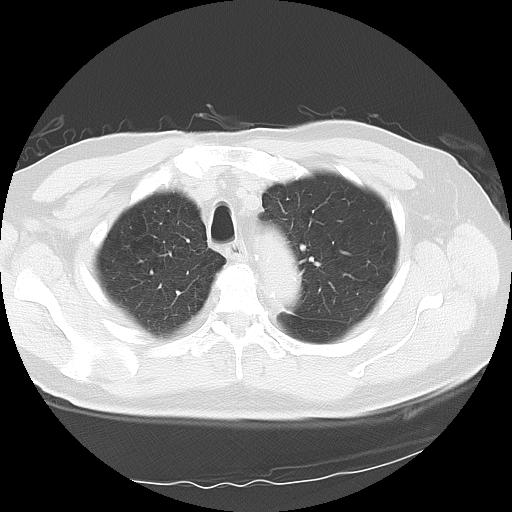

[Series 602: sagittal body · sagittal · 0.70mm/px · 8 of 145 slices shown]
[im 10/145  mediastinal]
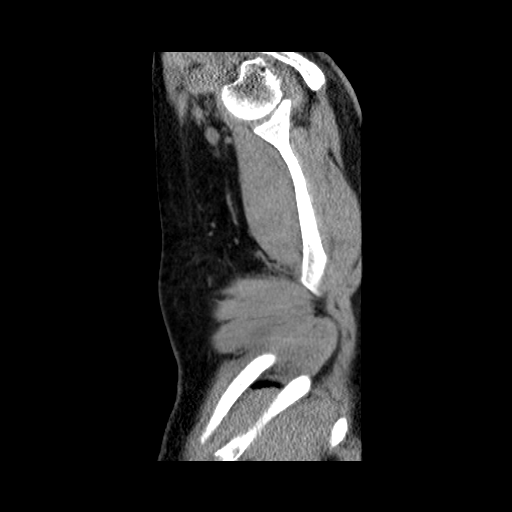
[im 29/145  mediastinal]
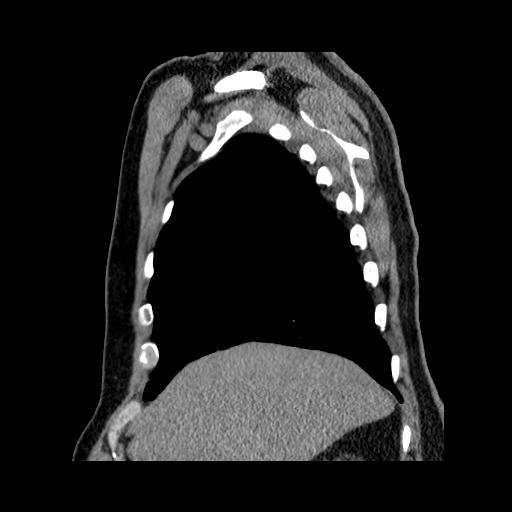
[im 49/145  mediastinal]
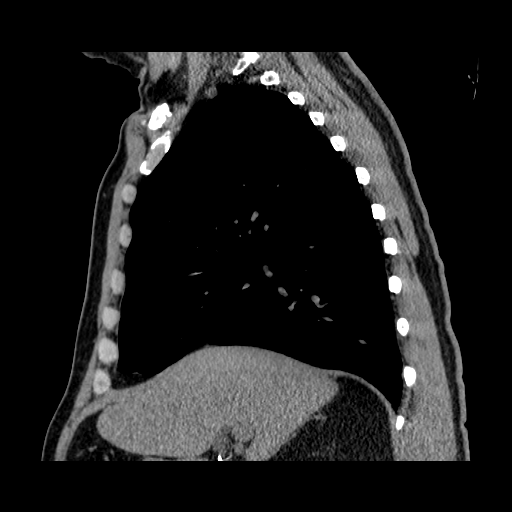
[im 68/145  mediastinal]
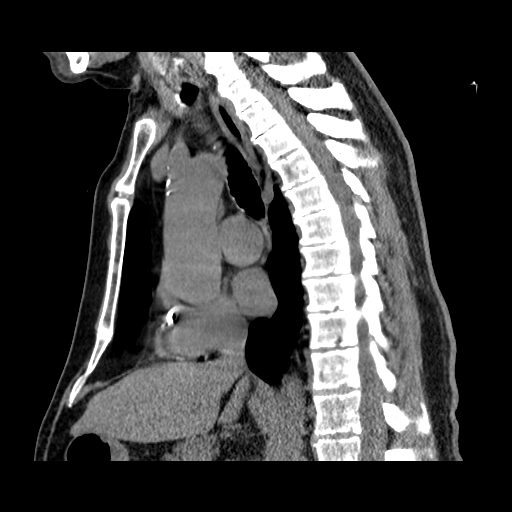
[im 77/145  mediastinal]
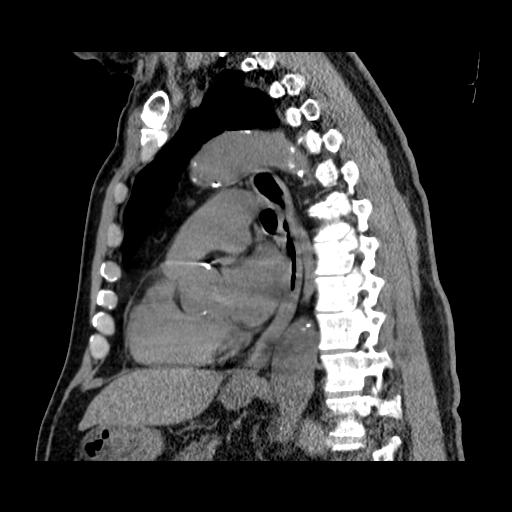
[im 97/145  mediastinal]
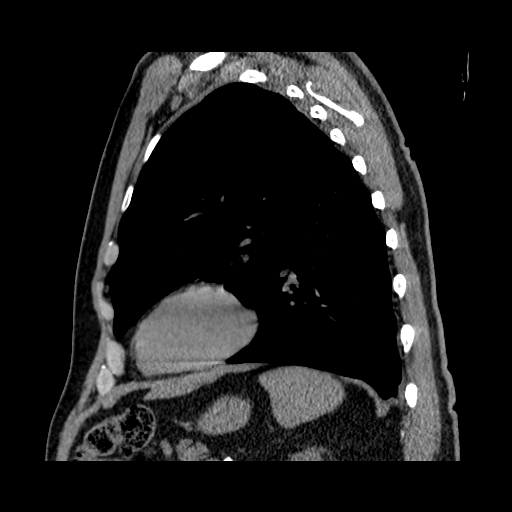
[im 116/145  mediastinal]
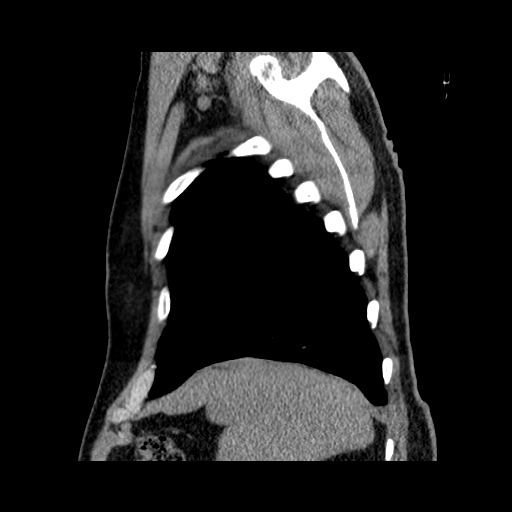
[im 135/145  mediastinal]
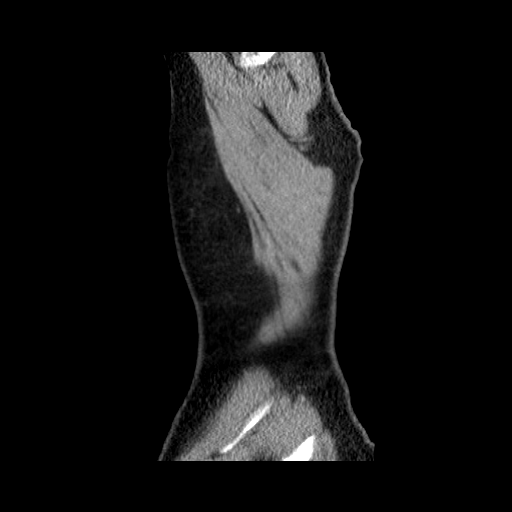

[16 of 30 positions shown; findings below may reference images not displayed]

FINDINGS: On the lung window images, changes of both paraseptal and
centrilobular emphysema are noted.  There is no lung infiltrate and
no effusion is seen.  No suspicious lung nodule is noted.  The
airway is patent.

On soft tissue window images, the thyroid gland is unremarkable.
The ascending aorta measured at approximately the same level as on
the prior study measures 3.9 x 3.9 cm compared to 4.0 x 3.9 cm
previously.  Atheromatous change is noted within the aortic arch
and the descending thoracic aorta.  No focal thoracic aortic
aneurysm is seen.  Coronary artery calcifications are noted
primarily in the distribution of the left anterior descending.  No
mediastinal or hilar adenopathy is seen.  There are degenerative
changes throughout the thoracic spine.
IMPRESSION: 1.  Stable fusiform dilatation of the ascending aorta with maximum
diameter of 4.0 x 3.9 cm.
2.  Emphysema.

## 2012-06-02 ENCOUNTER — Telehealth: Payer: Self-pay | Admitting: Cardiology

## 2012-06-02 NOTE — Telephone Encounter (Signed)
New problem    Question about clot in stomach

## 2012-06-12 NOTE — Telephone Encounter (Signed)
Spoke to patient 06/02/12 he stated he went to [facility] ER with swelling in left leg and was told he had blood clot in left groin.States he was started on coumadin and Dr.Rowena Sistasis is following coumadin.

## 2012-09-03 ENCOUNTER — Other Ambulatory Visit: Payer: Self-pay | Admitting: *Deleted

## 2012-09-03 MED ORDER — SIMVASTATIN 40 MG PO TABS: 40.0000 mg | ORAL_TABLET | Freq: Every day | ORAL | Status: DC

## 2012-11-26 ENCOUNTER — Encounter: Payer: Self-pay | Admitting: Cardiology

## 2012-11-26 ENCOUNTER — Ambulatory Visit (INDEPENDENT_AMBULATORY_CARE_PROVIDER_SITE_OTHER): Payer: Medicare Other | Admitting: Cardiology

## 2012-11-26 VITALS — BP 132/80 | HR 69 | Ht 68.0 in | Wt 164.0 lb

## 2012-11-26 DIAGNOSIS — E7800 Pure hypercholesterolemia, unspecified: Secondary | ICD-10-CM

## 2012-11-26 DIAGNOSIS — I712 Thoracic aortic aneurysm, without rupture, unspecified: Secondary | ICD-10-CM

## 2012-11-26 DIAGNOSIS — I10 Essential (primary) hypertension: Secondary | ICD-10-CM

## 2012-11-26 DIAGNOSIS — I2510 Atherosclerotic heart disease of native coronary artery without angina pectoris: Secondary | ICD-10-CM

## 2012-11-26 DIAGNOSIS — D693 Immune thrombocytopenic purpura: Secondary | ICD-10-CM

## 2012-11-26 NOTE — Progress Notes (Signed)
Drew Cisneros Date of Birth: 1940/06/30   History of Present Illness: Drew Cisneros is seen today for a follow up visit. He has a history of coronary disease. In 2010 we attempted to perform a PCI on the RCA. This was unsuccessful. He has been managed medically since then and is doing well. He denies any chest pain or shortness of breath. He also has a thoracic aortic aneurysm is followed every other year by Dr. Laneta Simmers. His lab work in August demonstrated significant elevation of his triglycerides over 600. His Zocor was stopped and he was prescribed Trilipix. He states he hasn't started taking this yet. He did have extensive lumbar surgery in February. He developed a left leg DVT and was on Coumadin for 6 months. He is back now walking at the mall for short distances. He is still bothered by hip pain.  Current Outpatient Prescriptions on File Prior to Visit  Medication Sig Dispense Refill  . cetirizine (ZYRTEC) 10 MG tablet Take 10 mg by mouth as needed.        . clobetasol cream (TEMOVATE) 0.05 % as needed.      . diazepam (VALIUM) 5 MG tablet Take 2.5 mg by mouth at bedtime as needed.      . doxazosin (CARDURA) 8 MG tablet Take 8 mg by mouth at bedtime.        [name] omeprazole (PRILOSEC) 20 MG capsule Take 20 mg by mouth daily as needed.       . propranolol (INDERAL) 40 MG tablet Take 40 mg by mouth 3 (three) times daily.       No current facility-administered medications on file prior to visit.    Allergies  Allergen Reactions  . Aspirin   . Codeine   . Compazine   . Fenofibrate   . Lovaza [Omega-3-Acid Ethyl Esters]   . Penicillins   . Zetia [Ezetimibe]     Past Medical History  Diagnosis Date  . ITP (idiopathic thrombocytopenic purpura)   . Thrombocytopenia   . Nausea   . Indigestion   . Coronary artery disease     has known single vessel disease. Unsuccessful PCI to RCA in 2010. Left systsem with moderate calcifications but no obstructive disease and left to right  collaterals.   [name] PAD (peripheral artery disease)     followed by Dr. Edilia Bo  . AAA (abdominal aortic aneurysm)     S/P AAA repair. Infra renal AAA measures 4.0 cm in August of 2012  . Iliac aneurysm     S/P bilateral iliac repair  . Hypercholesterolemia   . Hypertension   . Thoracic aortic aneurysm     followed by Dr. Laneta Simmers  . Renal artery stenosis     Left per cardiac cath in 2010  . Normal nuclear stress test October 2012    EF 72% with no ischemia  . Holter monitor, abnormal October 2012    with only rare PVC and PAC, NO pauses, No significant bradycardia  . DVT (deep venous thrombosis)     post lumbar surgery    Past Surgical History  Procedure Laterality Date  . Cardiac catheterization  2010    EF 60% with single vessel CAD. He underwent attempt at PCI  with dissection and had successful stenting of the prox RCA but unsuccessful PCI to the mid RCA. Mild to moderate disease in the left system noted.   . Abdominal aortic aneurysm repair    . Iliac artery aneurysm repair    .  Rotator cuff repair w/ distal clavicle excision    . Umbilical hernia repair    . Inguinal hernia repair    . Cholecystectomy    . Lumbar spine surgery      History  Smoking status  . Former Smoker  . Quit date: 07/21/1990  Smokeless tobacco  . Not on file    History  Alcohol Use No    Family History  Problem Relation Age of Onset  . Diabetes Mother   . Diabetes Brother   . Coronary artery disease Brother     Review of Systems: The review of systems is per the HPI.  All other systems were reviewed and are negative.  Physical Exam: BP 132/80  Pulse 69  Ht 5\' 8"  (1.727 m)  Wt 74.39 kg (164 lb)  BMI 24.94 kg/m2  Patient is alert and in no acute distress.Skin is warm and dry. Color is normal.  HEENT is unremarkable. Normocephalic/atraumatic. PERRL. Sclera are nonicteric. Neck is supple. No masses. No JVD. Lungs are clear. Cardiac exam shows a regular rate and rhythm. He has no  gallops, murmurs, or clicks. PMI is normal. Abdomen is soft. Extremities are without edema. Gait and ROM are intact. No gross neurologic deficits noted.  LABORATORY DATA:  ECG today demonstrates normal sinus rhythm with a rate of 69 beats per minute. It is otherwise normal.  Assessment / Plan: 1. Coronary disease with history of failed PCI of the RCA in 2010. Otherwise nonobstructive disease. He is asymptomatic. We will continue medical management with beta blocker. 2. Dyslipidemia. Agree with the addition of trilipix. Zocor stopped because of interactions. I would consider starting an alternative statin given the known risk reduction with statin therapy. 3. Thoracic aortic aneurysm. Asymptomatic. Followup planned for next year. 4. Hypertension-well controlled. 5. ITP.

## 2012-11-26 NOTE — Patient Instructions (Signed)
Continue your current therapy  You may need to consider going back on a statin once you see how you do on Trilipix.  I will see you in one year.

## 2013-01-08 ENCOUNTER — Ambulatory Visit (INDEPENDENT_AMBULATORY_CARE_PROVIDER_SITE_OTHER): Payer: Medicare Other | Admitting: General Surgery

## 2013-01-08 ENCOUNTER — Encounter (INDEPENDENT_AMBULATORY_CARE_PROVIDER_SITE_OTHER): Payer: Self-pay | Admitting: General Surgery

## 2013-01-08 VITALS — BP 124/78 | HR 68 | Resp 18 | Ht 68.0 in | Wt 167.0 lb

## 2013-01-08 DIAGNOSIS — R1031 Right lower quadrant pain: Secondary | ICD-10-CM

## 2013-01-08 NOTE — Progress Notes (Signed)
Patient ID: Drew Cisneros, male   DOB: 06-Nov-1940, 72 y.o.   MRN: [id number]  Chief Complaint  Patient presents with  . Establish Care    incision pain     HPI Drew Cisneros is a 72 y.o. male.   HPI  He is self-referred because of some right lower quadrant incisional pain, sinking in of the scar, and concern for hernia.  His major concern is that the incision has been sinking in and uncomfortable at times. There has been no bulging. He does have constipation and has to strain sometimes. He has lost some weight.  Past Medical History  Diagnosis Date  . ITP (idiopathic thrombocytopenic purpura)   . Thrombocytopenia   . Nausea   . Indigestion   . Coronary artery disease     has known single vessel disease. Unsuccessful PCI to RCA in 2010. Left systsem with moderate calcifications but no obstructive disease and left to right collaterals.   [name] PAD (peripheral artery disease)     followed by Dr. Edilia Bo  . AAA (abdominal aortic aneurysm)     S/P AAA repair. Infra renal AAA measures 4.0 cm in August of 2012  . Iliac aneurysm     S/P bilateral iliac repair  . Hypercholesterolemia   . Hypertension   . Thoracic aortic aneurysm     followed by Dr. Laneta Simmers  . Renal artery stenosis     Left per cardiac cath in 2010  . Normal nuclear stress test October 2012    EF 72% with no ischemia  . Holter monitor, abnormal October 2012    with only rare PVC and PAC, NO pauses, No significant bradycardia  . DVT (deep venous thrombosis)     post lumbar surgery    Past Surgical History  Procedure Laterality Date  . Cardiac catheterization  2010    EF 60% with single vessel CAD. He underwent attempt at PCI  with dissection and had successful stenting of the prox RCA but unsuccessful PCI to the mid RCA. Mild to moderate disease in the left system noted.   . Abdominal aortic aneurysm repair    . Iliac artery aneurysm repair    . Rotator cuff repair w/ distal clavicle excision    . Umbilical hernia  repair    . Inguinal hernia repair    . Cholecystectomy    . Lumbar spine surgery      Family History  Problem Relation Age of Onset  . Diabetes Mother   . Diabetes Brother   . Coronary artery disease Brother     Social History History  Substance Use Topics  . Smoking status: Former Smoker    Quit date: 07/21/1990  . Smokeless tobacco: Not on file  . Alcohol Use: No    Allergies  Allergen Reactions  . Aspirin   . Codeine   . Compazine   . Fenofibrate   . Lovaza [Omega-3-Acid Ethyl Esters]   . Penicillins   . Zetia [Ezetimibe]     Current Outpatient Prescriptions  Medication Sig Dispense Refill  . cetirizine (ZYRTEC) 10 MG tablet Take 10 mg by mouth as needed.        . clobetasol cream (TEMOVATE) 0.05 % as needed.      . diazepam (VALIUM) 5 MG tablet Take 2.5 mg by mouth at bedtime as needed.      . doxazosin (CARDURA) 8 MG tablet Take 8 mg by mouth at bedtime.        [name] omeprazole (  PRILOSEC) 20 MG capsule Take 20 mg by mouth daily as needed.       . propranolol (INDERAL) 40 MG tablet Take 40 mg by mouth 3 (three) times daily.       No current facility-administered medications for this visit.    Review of Systems Review of Systems  Constitutional: Negative.   HENT: Positive for tinnitus.   Gastrointestinal: Positive for abdominal pain and constipation.  Musculoskeletal:       Bilateral hip pain.  Hematological: Bruises/bleeds easily.    Blood pressure 124/78, pulse 68, resp. rate 18, height 5\' 8"  (1.727 m), weight 167 lb (75.751 kg).  Physical Exam Physical Exam  Constitutional: No distress.  Slightly overweight male.  HENT:  Head: Normocephalic and atraumatic.  Eyes: No scleral icterus.  Abdominal: Soft. He exhibits distension. There is no tenderness.  Long midline scar. Right upper quadrant scar. Right lower quadrant scar. Small transverse periumbilical scar. No palpable incisional hernias. There is some indentation in the right lower quadrant scar  area. No nodules present.  Neurological: He is alert.  Psychiatric: He has a normal mood and affect. His behavior is normal.    Data Reviewed none  Assessment    Right lower quadrant pain at the site of his previous appendectomy scar. No evidence of hernia. There is some indentation of the scar but no compromise of the skin.     Plan    I advised him to try to avoid the constipation and straining. I advised additional weight loss. Return visit as needed.        Gennie Dib J 01/08/2013, 10:07 AM

## 2013-01-08 NOTE — Patient Instructions (Addendum)
Take a laxative as needed.  Try to lose a little more weight from your abdominal area.

## 2013-06-11 ENCOUNTER — Other Ambulatory Visit: Payer: Self-pay | Admitting: *Deleted

## 2013-06-11 DIAGNOSIS — I712 Thoracic aortic aneurysm, without rupture, unspecified: Secondary | ICD-10-CM

## 2013-07-22 ENCOUNTER — Other Ambulatory Visit: Payer: Medicare Other

## 2013-07-22 ENCOUNTER — Ambulatory Visit: Payer: Medicare Other | Admitting: Surgery

## 2013-07-29 ENCOUNTER — Ambulatory Visit (INDEPENDENT_AMBULATORY_CARE_PROVIDER_SITE_OTHER): Payer: Medicare HMO | Admitting: Surgery

## 2013-07-29 ENCOUNTER — Encounter: Payer: Self-pay | Admitting: Surgery

## 2013-07-29 ENCOUNTER — Ambulatory Visit
Admission: RE | Admit: 2013-07-29 | Discharge: 2013-07-29 | Disposition: A | Discharge status: Home / Self Care | Payer: Medicare HMO | Source: Ambulatory Visit | Attending: Surgery | Admitting: Surgery

## 2013-07-29 VITALS — BP 110/76 | HR 78 | Resp 20 | Ht 68.0 in | Wt 171.0 lb

## 2013-07-29 DIAGNOSIS — I712 Thoracic aortic aneurysm, without rupture, unspecified: Secondary | ICD-10-CM

## 2013-07-29 NOTE — Progress Notes (Signed)
      HPI:  The patient returned today for followup of an ascending aortic aneurysm. I last saw him on 07/11/2011. A CT scan at that time showed the aneurysm to be stable at 4.0 cm. Since I last saw him he said that he has been feeling fairly well overall. He denies any chest or back pain. He said he has had some kidney dysfunction and therefore the scan was done without contrast.    Current Outpatient Prescriptions  Medication Sig Dispense Refill  . cetirizine (ZYRTEC) 10 MG tablet Take 10 mg by mouth as needed.        . clobetasol cream (TEMOVATE) 0.05 % as needed.      . diazepam (VALIUM) 5 MG tablet Take 2.5 mg by mouth at bedtime as needed.      . doxazosin (CARDURA) 8 MG tablet Take 8 mg by mouth at bedtime.        [name] omeprazole (PRILOSEC) 20 MG capsule Take 20 mg by mouth daily as needed.       . propranolol (INDERAL) 40 MG tablet Take 40 mg by mouth 3 (three) times daily.       No current facility-administered medications for this visit.     Physical Exam:  BP 110/76  Pulse 78  Resp 20  Ht 5\' 8"  (1.727 m)  Wt 171 lb (77.565 kg)  BMI 26.01 kg/m2  SpO2 96% He looks well.  Cardiac exam has a regular rate and rhythm with normal S1 and S2. There is no murmur, rub, or gallop.  Lung exam is clear.   Diagnostic Tests:  CLINICAL DATA: Thoracic aneurysm.  EXAM:  CT CHEST WITHOUT CONTRAST  TECHNIQUE:  Multidetector CT imaging of the chest was performed following the  standard protocol without IV contrast.  COMPARISON: CT 07/10/2011.  FINDINGS:  Ascending aortic aneurysm measuring 3.9 x 3.9 cm again noted,  unchanged. Mild increase in maximum transverse dimension of the  aortic arch to 3.8 cm up from 3.5 cm. Maximum transverse diameter of  the descending thoracic aorta is 3.5 cm, this is stable. Coronary  artery disease. Heart size normal.  Shotty mediastinal lymph nodes. These are nonspecific.  Large airways are clear. Patchy bilateral pulmonary infiltrates are  noted.  Active pneumonitis could present in this fashion. These  findings are new from prior study. No pleural effusion all  pneumothorax.  Cholecystectomy. Visualized upper abdominal viscera unremarkable.  Thyroid unremarkable. No supraclavicular or axillary adenopathy.  Chest wall is intact. Degenerative changes thoracic spine.  IMPRESSION:  1. Stable ascending aortic aneurysm measuring 3.9 x 3.9 cm again  noted. There is mild increase in maximum transverse diameter of the  aortic arch to 3.8 cm, up from 3.5 cm. On CT of 07/10/2011.  Descending thoracic aorta stable with maximum diameter 3.5 cm.  2. Coronary artery disease.  3. Patchy bilateral pulmonary infiltrates. Active pneumonitis should  be considered. These findings are new from prior exam.  Electronically Signed  By: [place]  On: 07/29/2013 12:48    Impression:  The ascending aortic aneurysm remains stable at a maximum diameter of 4.0 cm since June 2010.   Plan:  I will see him back in 2 years with a CT scan the chest without contrast.

## 2013-12-31 ENCOUNTER — Ambulatory Visit: Payer: Medicare HMO | Admitting: Cardiology

## 2014-03-08 ENCOUNTER — Encounter: Payer: Self-pay | Admitting: Cardiology

## 2014-03-08 ENCOUNTER — Ambulatory Visit (INDEPENDENT_AMBULATORY_CARE_PROVIDER_SITE_OTHER): Payer: PPO | Admitting: Cardiology

## 2014-03-08 VITALS — BP 102/70 | HR 81 | Ht 68.0 in | Wt 169.6 lb

## 2014-03-08 DIAGNOSIS — E7800 Pure hypercholesterolemia, unspecified: Secondary | ICD-10-CM

## 2014-03-08 DIAGNOSIS — I712 Thoracic aortic aneurysm, without rupture, unspecified: Secondary | ICD-10-CM

## 2014-03-08 DIAGNOSIS — I7789 Other specified disorders of arteries and arterioles: Secondary | ICD-10-CM

## 2014-03-08 DIAGNOSIS — I10 Essential (primary) hypertension: Secondary | ICD-10-CM

## 2014-03-08 DIAGNOSIS — I2510 Atherosclerotic heart disease of native coronary artery without angina pectoris: Secondary | ICD-10-CM

## 2014-03-08 DIAGNOSIS — D696 Thrombocytopenia, unspecified: Secondary | ICD-10-CM

## 2014-03-08 MED ORDER — DOXAZOSIN MESYLATE 8 MG PO TABS
8.0000 mg | ORAL_TABLET | Freq: Every day | ORAL | Status: DC
Start: 2014-03-08 — End: 2015-03-21

## 2014-03-08 MED ORDER — PROPRANOLOL HCL 40 MG PO TABS: 40.0000 mg | ORAL_TABLET | Freq: Three times a day (TID) | ORAL | Status: DC

## 2014-03-08 NOTE — Progress Notes (Signed)
Drew Cisneros Date of Birth: Oct 21, 1940   History of Present Illness: Drew Cisneros is seen today for follow up of CAD. He has a history of coronary disease. In 2010 we attempted to perform a PCI on the RCA. This was unsuccessful. He has been managed medically since then and is doing well. He denies any chest pain or shortness of breath. He also has a thoracic aortic aneurysm is followed every other year by Dr. Cyndia Bent. He last CT was stable at 4.0 cm. He is currently on no lipid lowering therapy. He did not tolerate Trilipix. He reports that he was told to stop taking Zocor due to his ITP. Last platelet count was about 80k.   Current Outpatient Prescriptions on File Prior to Visit  Medication Sig Dispense Refill  . cetirizine (ZYRTEC) 10 MG tablet Take 10 mg by mouth as needed.      . clobetasol cream (TEMOVATE) 0.05 % as needed.    . diazepam (VALIUM) 5 MG tablet Take 2.5 mg by mouth at bedtime as needed.    [name] omeprazole (PRILOSEC) 20 MG capsule Take 20 mg by mouth daily as needed.      No current facility-administered medications on file prior to visit.    Allergies  Allergen Reactions  . Aspirin   . Codeine   . Compazine   . Fenofibrate   . Lovaza [Omega-3-Acid Ethyl Esters]   . Penicillins   . Zetia [Ezetimibe]     Past Medical History  Diagnosis Date  . ITP (idiopathic thrombocytopenic purpura)   . Thrombocytopenia   . Nausea   . Indigestion   . Coronary artery disease     has known single vessel disease. Unsuccessful PCI to RCA in 2010. Left systsem with moderate calcifications but no obstructive disease and left to right collaterals.   [name] PAD (peripheral artery disease)     followed by Dr. Scot Dock  . AAA (abdominal aortic aneurysm)     S/P AAA repair. Infra renal AAA measures 4.0 cm in August of 2012  . Iliac aneurysm     S/P bilateral iliac repair  . Hypercholesterolemia   . Hypertension   . Thoracic aortic aneurysm     followed by Dr. Cyndia Bent  . Renal artery  stenosis     Left per cardiac cath in 2010  . Normal nuclear stress test October 2012    EF 72% with no ischemia  . Holter monitor, abnormal October 2012    with only rare PVC and PAC, NO pauses, No significant bradycardia  . DVT (deep venous thrombosis)     post lumbar surgery    Past Surgical History  Procedure Laterality Date  . Cardiac catheterization  2010    EF 60% with single vessel CAD. He underwent attempt at PCI  with dissection and had successful stenting of the prox RCA but unsuccessful PCI to the mid RCA. Mild to moderate disease in the left system noted.   . Abdominal aortic aneurysm repair    . Iliac artery aneurysm repair    . Rotator cuff repair w/ distal clavicle excision    . Umbilical hernia repair    . Inguinal hernia repair    . Cholecystectomy    . Lumbar spine surgery      History  Smoking status  . Former Smoker  . Quit date: 07/21/1990  Smokeless tobacco  . Not on file    History  Alcohol Use No    Family History  Problem  Relation Age of Onset  . Diabetes Mother   . Diabetes Brother   . Coronary artery disease Brother     Review of Systems: The review of systems is per the HPI.  He complains that he sweats easily with exertion. All other systems were reviewed and are negative.  Physical Exam: BP 102/70 mmHg  Pulse 81  Ht 5\' 8"  (1.727 m)  Wt 169 lb 9.6 oz (76.93 kg)  BMI 25.79 kg/m2  He is a WDWM,  alert and in no acute distress.Skin is warm and dry. Color is normal.  HEENT is unremarkable. Normocephalic/atraumatic. PERRL. Sclera are nonicteric. Neck is supple. No masses. No JVD. Lungs are clear. Cardiac exam shows a regular rate and rhythm. He has no gallops, murmurs, or clicks. PMI is normal. Abdomen is soft. Extremities are without edema. Gait and ROM are intact. No gross neurologic deficits noted.  LABORATORY DATA:  ECG today demonstrates normal sinus rhythm with frequent PVCs. Rate 81.  It is otherwise normal. I have personally  reviewed and interpreted this study.   Assessment / Plan: 1. Coronary disease with history of failed PCI of the RCA in 2010. Otherwise nonobstructive disease. He is asymptomatic. We will continue medical management with beta blocker. I have recommended a follow up [place].  2. Dyslipidemia. Not sure about relation of statin to ITP. Intolerant of Trilipix.  3. Thoracic aortic aneurysm. Asymptomatic. CT stable at 4.0 cm.  4. Hypertension-well controlled. 5. ITP. 6. PVCs. Asymptomatic. On beta blocker. Will assess for ischemia with Myoview.

## 2014-03-08 NOTE — Patient Instructions (Signed)
We will schedule you for a nuclear stress test.  I will get a copy of your lab work   I will see you in 1 year

## 2014-03-10 ENCOUNTER — Telehealth (HOSPITAL_COMMUNITY): Payer: Self-pay

## 2014-03-10 NOTE — Telephone Encounter (Signed)
Encounter complete. 

## 2014-03-11 ENCOUNTER — Ambulatory Visit (HOSPITAL_COMMUNITY)
Admission: RE | Admit: 2014-03-11 | Discharge: 2014-03-11 | Disposition: A | Discharge status: Home / Self Care | Payer: PPO | Source: Ambulatory Visit | Attending: Cardiovascular Disease | Admitting: Cardiovascular Disease

## 2014-03-11 DIAGNOSIS — R0600 Dyspnea, unspecified: Secondary | ICD-10-CM | POA: Insufficient documentation

## 2014-03-11 DIAGNOSIS — R5383 Other fatigue: Secondary | ICD-10-CM | POA: Diagnosis not present

## 2014-03-11 DIAGNOSIS — D696 Thrombocytopenia, unspecified: Secondary | ICD-10-CM

## 2014-03-11 DIAGNOSIS — J449 Chronic obstructive pulmonary disease, unspecified: Secondary | ICD-10-CM | POA: Insufficient documentation

## 2014-03-11 DIAGNOSIS — I712 Thoracic aortic aneurysm, without rupture, unspecified: Secondary | ICD-10-CM

## 2014-03-11 DIAGNOSIS — I10 Essential (primary) hypertension: Secondary | ICD-10-CM | POA: Diagnosis not present

## 2014-03-11 DIAGNOSIS — R079 Chest pain, unspecified: Secondary | ICD-10-CM | POA: Insufficient documentation

## 2014-03-11 DIAGNOSIS — I2510 Atherosclerotic heart disease of native coronary artery without angina pectoris: Secondary | ICD-10-CM

## 2014-03-11 DIAGNOSIS — E7800 Pure hypercholesterolemia, unspecified: Secondary | ICD-10-CM

## 2014-03-11 DIAGNOSIS — I7789 Other specified disorders of arteries and arterioles: Secondary | ICD-10-CM

## 2014-03-11 MED ORDER — TECHNETIUM TC 99M SESTAMIBI GENERIC - CARDIOLITE
10.6000 | Freq: Once | INTRAVENOUS | Status: AC | PRN
  Administered 2014-03-11: 11 via INTRAVENOUS

## 2014-03-11 MED ORDER — AMINOPHYLLINE 25 MG/ML IV SOLN
75.0000 mg | Freq: Once | INTRAVENOUS | Status: AC
  Administered 2014-03-11: 75 mg via INTRAVENOUS

## 2014-03-11 MED ORDER — TECHNETIUM TC 99M SESTAMIBI GENERIC - CARDIOLITE
31.8000 | Freq: Once | INTRAVENOUS | Status: AC | PRN
  Administered 2014-03-11: 31.8 via INTRAVENOUS

## 2014-03-11 MED ORDER — REGADENOSON 0.4 MG/5ML IV SOLN
0.4000 mg | Freq: Once | INTRAVENOUS | Status: AC
  Administered 2014-03-11: 0.4 mg via INTRAVENOUS

## 2014-03-11 NOTE — Procedures (Addendum)
  [facility] [address] [place] [address] [phone]  Cardiology Nuclear Med Study  Drew Cisneros is a 74 y.o. male     MRN : [id number]     DOB: [birth year]  Procedure Date: 03/11/2014  Nuclear Med Background Indication for Stress Test:  Evaluation for Ischemia and Follow up CAD History:  COPD and CAD;PVD'AAA;DVT;Last NUC MPI on 12/06/2010-normal;EF=72% Cardiac Risk Factors: Family History - CAD, History of Smoking, Hypertension, Lipids, Overweight and PVD  Symptoms:  Chest Pain, Diaphoresis, DOE, Fatigue, Nausea and Palpitations   Nuclear Pre-Procedure Caffeine/Decaff Intake:  1:00am NPO After: 11am   IV Site: R Forearm  IV 0.9% NS with Angio Cath:  22g  Chest Size (in):  42"  IV Started by: Rolene Course, RN  Height: 5\' 8"  (1.727 m)  Cup Size: n/a  BMI:  Body mass index is 25.7 kg/(m^2). Weight:  169 lb (76.658 kg)   Tech Comments:  n/a    Nuclear Med Study 1 or 2 day study: 1 day  Stress Test Type:  [place] Provider:  Peter Martinique, MD   Resting Radionuclide: Technetium 69m Sestamibi  Resting Radionuclide Dose: 10.6 mCi   Stress Radionuclide:  Technetium 66m Sestamibi  Stress Radionuclide Dose: 31.8 mCi           Stress Protocol Rest HR: 79 Stress HR: 97  Rest BP: 126/93 Stress BP: 150/77  Exercise Time (min): n/a METS: n/a          Dose of Adenosine (mg):  n/a Dose of Lexiscan: 0.4 mg  Dose of Atropine (mg): n/a Dose of Dobutamine: n/a mcg/kg/min (at max HR)  Stress Test Technologist: Mellody Memos, CCT Nuclear Technologist: Imagene Riches, CNMT   Rest Procedure:  Myocardial perfusion imaging was performed at rest 45 minutes following the intravenous administration of Technetium 5m Sestamibi. Stress Procedure:  The patient received IV Lexiscan 0.4 mg over 15-seconds.  Technetium 68m Sestamibi injected IV at 30-seconds.  Patient experienced shortness of breath and was  administered 75 mg of Aminophylline IV. There were no significant changes with Lexiscan.  Quantitative spect images were obtained after a 45 minute delay.  Transient Ischemic Dilatation (Normal <1.22):  0.99  QGS EDV:  75 ml QGS ESV:  25 ml LV Ejection Fraction: 67%   Rest ECG: NSR - Normal EKG  Stress ECG: Atrial bigeminy noted  QPS Raw Data Images:  Normal; no motion artifact; normal heart/lung ratio. Stress Images:  There is decreased uptake in the lateral wall. Rest Images:  There is decreased uptake in the lateral wall. Subtraction (SDS):  No evidence of ischemia.  Impression Exercise Capacity:  Lexiscan with no exercise. BP Response:  Hypotensive blood pressure response. Clinical Symptoms:  No significant symptoms noted. ECG Impression:  No significant ECG changes with Lexiscan. Comparison with Prior Nuclear Study: No previous nuclear study performed  Overall Impression:  Low risk stress nuclear study with small, mild inferolateral bowel attenuation artifact.  LV Wall Motion:  NL LV Function; NL Wall Motion; EF 67%  Pixie Casino, MD, [facility] Board Certified in Nuclear Cardiology Attending Cardiologist [place] C, MD  03/11/2014 4:28 PM

## 2014-03-23 ENCOUNTER — Encounter (HOSPITAL_COMMUNITY): Payer: PPO

## 2015-03-08 DIAGNOSIS — B353 Tinea pedis: Secondary | ICD-10-CM | POA: Diagnosis not present

## 2015-03-08 DIAGNOSIS — L821 Other seborrheic keratosis: Secondary | ICD-10-CM | POA: Diagnosis not present

## 2015-03-08 DIAGNOSIS — L853 Xerosis cutis: Secondary | ICD-10-CM | POA: Diagnosis not present

## 2015-03-08 DIAGNOSIS — L820 Inflamed seborrheic keratosis: Secondary | ICD-10-CM | POA: Diagnosis not present

## 2015-03-08 DIAGNOSIS — L308 Other specified dermatitis: Secondary | ICD-10-CM | POA: Diagnosis not present

## 2015-03-08 DIAGNOSIS — D225 Melanocytic nevi of trunk: Secondary | ICD-10-CM | POA: Diagnosis not present

## 2015-03-21 ENCOUNTER — Encounter: Payer: Self-pay | Admitting: Cardiology

## 2015-03-21 ENCOUNTER — Ambulatory Visit (INDEPENDENT_AMBULATORY_CARE_PROVIDER_SITE_OTHER): Payer: PPO | Admitting: Cardiology

## 2015-03-21 VITALS — BP 118/98 | HR 83 | Ht 68.0 in | Wt 168.0 lb

## 2015-03-21 DIAGNOSIS — I2510 Atherosclerotic heart disease of native coronary artery without angina pectoris: Secondary | ICD-10-CM | POA: Diagnosis not present

## 2015-03-21 DIAGNOSIS — I10 Essential (primary) hypertension: Secondary | ICD-10-CM

## 2015-03-21 DIAGNOSIS — E7800 Pure hypercholesterolemia, unspecified: Secondary | ICD-10-CM | POA: Diagnosis not present

## 2015-03-21 DIAGNOSIS — I7789 Other specified disorders of arteries and arterioles: Secondary | ICD-10-CM | POA: Diagnosis not present

## 2015-03-21 MED ORDER — DOXAZOSIN MESYLATE 8 MG PO TABS: 8.0000 mg | ORAL_TABLET | Freq: Every day | ORAL | Status: DC

## 2015-03-21 MED ORDER — METOPROLOL TARTRATE 50 MG PO TABS: 50.0000 mg | ORAL_TABLET | Freq: Two times a day (BID) | ORAL | Status: DC

## 2015-03-21 NOTE — Patient Instructions (Signed)
We will switch Inderal to metoprolol 50 mg twice a day  Continue your other therapy  Avoid sweets and simple carbohydrates.  I will see you in one year

## 2015-03-21 NOTE — Progress Notes (Signed)
Drew Cisneros Date of Birth: 1940-03-22   History of Present Illness: Drew Cisneros is seen today for follow up of CAD. He has a history of coronary disease. In 2010 we attempted to perform a PCI on the RCA. This was unsuccessful. He has been managed medically since then and is doing well. He denies any chest pain or shortness of breath. He also has enlargement of the thoracic aorta and  is followed every other year by Dr. Cyndia Cisneros. He last CT was stable at 4.0 cm. He has hypertriglyceridemia but is intolerant of multiple lipid lowering drugs including fenofibrate, Trilipix, crestor, Zocor, Mevacor, and Lovaza. Complains of ED.   Current Outpatient Prescriptions on File Prior to Visit  Medication Sig Dispense Refill  . cetirizine (ZYRTEC) 10 MG tablet Take 10 mg by mouth as needed.      . clobetasol cream (TEMOVATE) 0.05 % as needed.    . diazepam (VALIUM) 5 MG tablet Take 2.5 mg by mouth at bedtime as needed.     No current facility-administered medications on file prior to visit.    Allergies  Allergen Reactions  . Aspirin   . Codeine   . Compazine   . Fenofibrate   . Lovaza [Omega-3-Acid Ethyl Esters]   . Penicillins   . Zetia [Ezetimibe]     Past Medical History  Diagnosis Date  . ITP (idiopathic thrombocytopenic purpura)   . Thrombocytopenia ([place])   . Nausea   . Indigestion   . Coronary artery disease     has known single vessel disease. Unsuccessful PCI to RCA in 2010. Left systsem with moderate calcifications but no obstructive disease and left to right collaterals.   [name] PAD (peripheral artery disease) ([place])     followed by Dr. Scot Cisneros  . AAA (abdominal aortic aneurysm) (HCC)     S/P AAA repair. Infra renal AAA measures 4.0 cm in August of 2012  . 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 Hypercholesterolemia   . Hypertension   . Thoracic aortic aneurysm ([place])     followed by Dr. Cyndia Cisneros  . Renal artery stenosis (HCC)     Left per cardiac cath in 2010   . Normal nuclear stress test October 2012    EF 72% with no ischemia  . Holter monitor, abnormal October 2012    with only rare PVC and PAC, NO pauses, No significant bradycardia  . DVT (deep venous thrombosis) (Drew Cisneros)     post lumbar surgery    Past Surgical History  Procedure Laterality Date  . Cardiac catheterization  2010    EF 60% with single vessel CAD. He underwent attempt at PCI  with dissection and had successful stenting of the prox RCA but unsuccessful PCI to the mid RCA. Mild to moderate disease in the left system noted.   . Abdominal aortic aneurysm repair    . Iliac artery aneurysm repair    . Rotator cuff repair w/ distal clavicle excision    . Umbilical hernia repair    . Inguinal hernia repair    . Cholecystectomy    . Lumbar spine surgery      History  Smoking status  . Former Smoker  . Quit date: 07/21/1990  Smokeless tobacco  . Not on file    History  Alcohol Use No    Family History  Problem Relation Age of Onset  . Diabetes Mother   . Diabetes Brother   . Coronary artery disease  Brother     Review of Systems: The review of systems is per the HPI.  All other systems were reviewed and are negative.  Physical Exam: BP 118/98 mmHg  Pulse 83  Ht 5\' 8"  (1.727 m)  Wt 76.204 kg (168 lb)  BMI 25.55 kg/m2  He is a WDWM,  alert and in no acute distress.Skin is warm and dry. Color is normal.  HEENT is unremarkable. Normocephalic/atraumatic. PERRL. Sclera are nonicteric. Neck is supple. No masses. No JVD. Lungs are clear. Cardiac exam shows a regular rate and rhythm. He has no gallops, murmurs, or clicks. PMI is normal. Abdomen is soft. Extremities are without edema. Gait and ROM are intact. No gross neurologic deficits noted.  LABORATORY DATA:  ECG today demonstrates normal sinus rhythm with normal Ecg.  I have personally reviewed and interpreted this study.   Assessment / Plan: 1. Coronary disease with history of failed PCI of the RCA in 2010.  Otherwise nonobstructive disease. He is asymptomatic. We will continue medical management with beta blocker. myoview study in Jan. 2016 showed a small inferolateral scar without ischemia. 2. Dyslipidemia with predominant hypertriglyceridemia.  Intolerant of multiple drugs. Stressed importance of dietary modifications with weight loss. Avoid simple carbs and sweets. He is poorly motivated to make changes.  3. Thoracic aortic aneurysm. Asymptomatic. CT stable at 4.0 cm.  4. Hypertension-well controlled. 5. History of ITP. 6. PVCs. Asymptomatic. On beta blocker. Would favor switching Inderal to metoprolol 50 mg bid.

## 2015-03-22 DIAGNOSIS — R197 Diarrhea, unspecified: Secondary | ICD-10-CM | POA: Diagnosis not present

## 2015-03-23 ENCOUNTER — Telehealth: Payer: Self-pay | Admitting: Cardiology

## 2015-03-23 DIAGNOSIS — R197 Diarrhea, unspecified: Secondary | ICD-10-CM | POA: Diagnosis not present

## 2015-03-23 NOTE — Telephone Encounter (Signed)
Pt called in stating that he has been switched to Metoprolol and this morning he woke up at 6am with his heart racing at 100bpm . He is concerned and feels that his dosage may need to change   HR now is down to 79 and BP 115/79 During the time patient felt his tinnitus really was bothering him when he woke up to his heart racing at 100 bpm Patient feels he needs to take this medication three times a day like he took his inderal or needs an adjustment in dosage Is now on Metoprolol 50 mg twice a day

## 2015-03-23 NOTE — Telephone Encounter (Signed)
Reviewed with patient Dr. Doug Sou instructions Will take for 5 more doses and contact us if he still has c/o heart racing of greater than 100 bpm

## 2015-03-23 NOTE — Telephone Encounter (Signed)
Pt called in stating that he has been switched to Metoprolol and this morning he woke up at 6am with his heart racing at 100bpm . He is concerned and feels that his dosage may need to change. Please f/u with him  Thanks

## 2015-03-23 NOTE — Telephone Encounter (Signed)
I don't think we can make judgement after just one dose of metoprolol. It has a longer half life and we need to wait until he is at steady state--If he still has increased HR after 5 doses we can think about adjusting dose.  '[place] Giammona Martinique MD, [facility]

## 2015-03-25 ENCOUNTER — Telehealth: Payer: Self-pay | Admitting: Cardiology

## 2015-03-25 NOTE — Telephone Encounter (Signed)
Please call,concerning his medicine. °

## 2015-03-25 NOTE — Telephone Encounter (Signed)
Returned call to patient he stated Metoprolol causing ringing in ears worse.Stated he wanted to go back to Inderal 40 mg three times a day.Advised I will let Dr.Jordan know.

## 2015-03-28 DIAGNOSIS — K5730 Diverticulosis of large intestine without perforation or abscess without bleeding: Secondary | ICD-10-CM | POA: Diagnosis not present

## 2015-03-28 DIAGNOSIS — R197 Diarrhea, unspecified: Secondary | ICD-10-CM | POA: Diagnosis not present

## 2015-03-28 DIAGNOSIS — D125 Benign neoplasm of sigmoid colon: Secondary | ICD-10-CM | POA: Diagnosis not present

## 2015-03-28 MED ORDER — PROPRANOLOL HCL 40 MG PO TABS: 40.0000 mg | ORAL_TABLET | Freq: Three times a day (TID) | ORAL | Status: DC

## 2015-03-28 NOTE — Telephone Encounter (Signed)
Returned call to patient spoke to wife Dr.Jordan advised ok to stop metoprolol and restart inderal 40 mg three times a day.

## 2015-03-31 ENCOUNTER — Telehealth: Payer: Self-pay | Admitting: Cardiology

## 2015-03-31 MED ORDER — PROPRANOLOL HCL 40 MG PO TABS: 40.0000 mg | ORAL_TABLET | Freq: Three times a day (TID) | ORAL | Status: DC

## 2015-03-31 NOTE — Telephone Encounter (Signed)
Returned call to patient.90 day refill for propranolol sent to pharmacy.

## 2015-03-31 NOTE — Telephone Encounter (Signed)
New message     Talk to the nurse about his propranolol.  It was called in to the pharmacy incorrect.

## 2015-05-26 DIAGNOSIS — J441 Chronic obstructive pulmonary disease with (acute) exacerbation: Secondary | ICD-10-CM | POA: Diagnosis not present

## 2015-06-13 DIAGNOSIS — R7301 Impaired fasting glucose: Secondary | ICD-10-CM | POA: Diagnosis not present

## 2015-06-13 DIAGNOSIS — N400 Enlarged prostate without lower urinary tract symptoms: Secondary | ICD-10-CM | POA: Diagnosis not present

## 2015-06-13 DIAGNOSIS — I129 Hypertensive chronic kidney disease with stage 1 through stage 4 chronic kidney disease, or unspecified chronic kidney disease: Secondary | ICD-10-CM | POA: Diagnosis not present

## 2015-06-13 DIAGNOSIS — N183 Chronic kidney disease, stage 3 (moderate): Secondary | ICD-10-CM | POA: Diagnosis not present

## 2015-06-13 DIAGNOSIS — E785 Hyperlipidemia, unspecified: Secondary | ICD-10-CM | POA: Diagnosis not present

## 2015-06-16 DIAGNOSIS — J441 Chronic obstructive pulmonary disease with (acute) exacerbation: Secondary | ICD-10-CM | POA: Diagnosis not present

## 2015-06-16 DIAGNOSIS — E781 Pure hyperglyceridemia: Secondary | ICD-10-CM | POA: Diagnosis not present

## 2015-06-16 DIAGNOSIS — I10 Essential (primary) hypertension: Secondary | ICD-10-CM | POA: Diagnosis not present

## 2015-06-16 DIAGNOSIS — R7301 Impaired fasting glucose: Secondary | ICD-10-CM | POA: Diagnosis not present

## 2015-06-16 DIAGNOSIS — D696 Thrombocytopenia, unspecified: Secondary | ICD-10-CM | POA: Diagnosis not present

## 2015-06-24 ENCOUNTER — Other Ambulatory Visit: Payer: Self-pay | Admitting: *Deleted

## 2015-06-24 DIAGNOSIS — I723 Aneurysm of iliac artery: Secondary | ICD-10-CM

## 2015-06-24 DIAGNOSIS — I712 Thoracic aortic aneurysm, without rupture, unspecified: Secondary | ICD-10-CM

## 2015-06-30 DIAGNOSIS — L301 Dyshidrosis [pompholyx]: Secondary | ICD-10-CM | POA: Diagnosis not present

## 2015-06-30 DIAGNOSIS — H01115 Allergic dermatitis of left lower eyelid: Secondary | ICD-10-CM | POA: Diagnosis not present

## 2015-07-14 DIAGNOSIS — C44119 Basal cell carcinoma of skin of left eyelid, including canthus: Secondary | ICD-10-CM | POA: Diagnosis not present

## 2015-07-20 ENCOUNTER — Other Ambulatory Visit: Payer: PPO

## 2015-07-20 ENCOUNTER — Ambulatory Visit: Payer: PPO | Admitting: Surgery

## 2015-07-27 ENCOUNTER — Ambulatory Visit: Payer: PPO | Admitting: Surgery

## 2015-08-25 DIAGNOSIS — J441 Chronic obstructive pulmonary disease with (acute) exacerbation: Secondary | ICD-10-CM | POA: Diagnosis not present

## 2015-09-14 DIAGNOSIS — E781 Pure hyperglyceridemia: Secondary | ICD-10-CM | POA: Diagnosis not present

## 2015-09-14 DIAGNOSIS — D696 Thrombocytopenia, unspecified: Secondary | ICD-10-CM | POA: Diagnosis not present

## 2015-10-24 DIAGNOSIS — M25572 Pain in left ankle and joints of left foot: Secondary | ICD-10-CM | POA: Diagnosis not present

## 2015-11-10 DIAGNOSIS — R7301 Impaired fasting glucose: Secondary | ICD-10-CM | POA: Diagnosis not present

## 2015-11-10 DIAGNOSIS — E785 Hyperlipidemia, unspecified: Secondary | ICD-10-CM | POA: Diagnosis not present

## 2015-11-10 DIAGNOSIS — I10 Essential (primary) hypertension: Secondary | ICD-10-CM | POA: Diagnosis not present

## 2015-11-14 DIAGNOSIS — Z0000 Encounter for general adult medical examination without abnormal findings: Secondary | ICD-10-CM | POA: Insufficient documentation

## 2015-11-14 DIAGNOSIS — I10 Essential (primary) hypertension: Secondary | ICD-10-CM | POA: Diagnosis not present

## 2015-12-05 DIAGNOSIS — M25531 Pain in right wrist: Secondary | ICD-10-CM | POA: Diagnosis not present

## 2015-12-13 DIAGNOSIS — L299 Pruritus, unspecified: Secondary | ICD-10-CM | POA: Diagnosis not present

## 2015-12-13 DIAGNOSIS — L300 Nummular dermatitis: Secondary | ICD-10-CM | POA: Diagnosis not present

## 2015-12-13 DIAGNOSIS — L820 Inflamed seborrheic keratosis: Secondary | ICD-10-CM | POA: Diagnosis not present

## 2016-01-03 DIAGNOSIS — B351 Tinea unguium: Secondary | ICD-10-CM | POA: Diagnosis not present

## 2016-01-03 DIAGNOSIS — L820 Inflamed seborrheic keratosis: Secondary | ICD-10-CM | POA: Diagnosis not present

## 2016-01-03 DIAGNOSIS — L300 Nummular dermatitis: Secondary | ICD-10-CM | POA: Diagnosis not present

## 2016-02-13 DIAGNOSIS — I10 Essential (primary) hypertension: Secondary | ICD-10-CM | POA: Diagnosis not present

## 2016-02-13 DIAGNOSIS — R7301 Impaired fasting glucose: Secondary | ICD-10-CM | POA: Diagnosis not present

## 2016-02-13 DIAGNOSIS — E785 Hyperlipidemia, unspecified: Secondary | ICD-10-CM | POA: Diagnosis not present

## 2016-03-17 NOTE — Progress Notes (Deleted)
Drew Cisneros Date of Birth: 12-17-1940   History of Present Illness: Drew Cisneros is seen today for follow up of CAD. He has a history of coronary disease. In 2010 we attempted to perform a PCI on the RCA. This was unsuccessful. He has been managed medically since then and is doing well. He denies any chest pain or shortness of breath. He also has enlargement of the thoracic aorta and  is followed every other year by Dr. Cyndia Bent. He last CT was stable at 4.0 cm. He has hypertriglyceridemia but is intolerant of multiple lipid lowering drugs including fenofibrate, Trilipix, crestor, Zocor, Mevacor, and Lovaza. Complains of ED.   Current Outpatient Prescriptions on File Prior to Visit  Medication Sig Dispense Refill  . cetirizine (ZYRTEC) 10 MG tablet Take 10 mg by mouth as needed.      . clobetasol cream (TEMOVATE) 0.05 % as needed.    . diazepam (VALIUM) 5 MG tablet Take 2.5 mg by mouth at bedtime as needed.    . doxazosin (CARDURA) 8 MG tablet Take 1 tablet (8 mg total) by mouth at bedtime. 90 tablet 3  . propranolol (INDERAL) 40 MG tablet Take 1 tablet (40 mg total) by mouth 3 (three) times daily. 270 tablet 3   No current facility-administered medications on file prior to visit.     Allergies  Allergen Reactions  . Aspirin   . Codeine   . Compazine   . Fenofibrate   . Lovaza [Omega-3-Acid Ethyl Esters]   . Penicillins   . Zetia [Ezetimibe]     Past Medical History:  Diagnosis Date  . AAA (abdominal aortic aneurysm) (HCC)    S/P AAA repair. Infra renal AAA measures 4.0 cm in August of 2012  . Coronary artery disease    has known single vessel disease. Unsuccessful PCI to RCA in 2010. Left systsem with moderate calcifications but no obstructive disease and left to right collaterals.   [name] DVT (deep venous thrombosis) ([place])    post lumbar surgery  . Holter monitor, abnormal October 2012   with only rare PVC and PAC, NO pauses, No significant bradycardia  . Hypercholesterolemia    . Hypertension   . 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 Indigestion   . ITP (idiopathic thrombocytopenic purpura)   . Nausea   . Normal nuclear stress test October 2012   EF 72% with no ischemia  . PAD (peripheral artery disease) ([place])    followed by Dr. Scot Dock  . Renal artery stenosis (HCC)    Left per cardiac cath in 2010  . Thoracic aortic aneurysm ([place])    followed by Dr. Cyndia Bent  . Thrombocytopenia ([place])     Past Surgical History:  Procedure Laterality Date  . ABDOMINAL AORTIC ANEURYSM REPAIR    . CARDIAC CATHETERIZATION  2010   EF 60% with single vessel CAD. He underwent attempt at PCI  with dissection and had successful stenting of the prox RCA but unsuccessful PCI to the mid RCA. Mild to moderate disease in the left system noted.   . CHOLECYSTECTOMY    . ILIAC ARTERY ANEURYSM REPAIR    . INGUINAL HERNIA REPAIR    . LUMBAR SPINE SURGERY    . ROTATOR CUFF REPAIR W/ DISTAL CLAVICLE EXCISION    . UMBILICAL HERNIA REPAIR      History  Smoking Status  . Former Smoker  . Quit date: 07/21/1990  Smokeless Tobacco  . Not on file  History  Alcohol Use No    Family History  Problem Relation Age of Onset  . Diabetes Mother   . Diabetes Brother   . Coronary artery disease Brother     Review of Systems: The review of systems is per the HPI.  All other systems were reviewed and are negative.  Physical Exam: There were no vitals taken for this visit.  He is a WDWM,  alert and in no acute distress.Skin is warm and dry. Color is normal.  HEENT is unremarkable. Normocephalic/atraumatic. PERRL. Sclera are nonicteric. Neck is supple. No masses. No JVD. Lungs are clear. Cardiac exam shows a regular rate and rhythm. He has no gallops, murmurs, or clicks. PMI is normal. Abdomen is soft. Extremities are without edema. Gait and ROM are intact. No gross neurologic deficits noted.  LABORATORY DATA:  ECG today demonstrates normal sinus rhythm with normal Ecg.   I have personally reviewed and interpreted this study.   Assessment / Plan: 1. Coronary disease with history of failed PCI of the RCA in 2010. Otherwise nonobstructive disease. He is asymptomatic. We will continue medical management with beta blocker. myoview study in Jan. 2016 showed a small inferolateral scar without ischemia. 2. Dyslipidemia with predominant hypertriglyceridemia.  Intolerant of multiple drugs. Stressed importance of dietary modifications with weight loss. Avoid simple carbs and sweets. He is poorly motivated to make changes.  3. Thoracic aortic aneurysm. Asymptomatic. CT stable at 4.0 cm.  4. Hypertension-well controlled. 5. History of ITP. 6. PVCs. Asymptomatic. On beta blocker. Would favor switching Inderal to metoprolol 50 mg bid.

## 2016-03-22 ENCOUNTER — Ambulatory Visit: Payer: PPO | Admitting: Cardiology

## 2016-04-02 DIAGNOSIS — L309 Dermatitis, unspecified: Secondary | ICD-10-CM | POA: Diagnosis not present

## 2016-05-17 DIAGNOSIS — J441 Chronic obstructive pulmonary disease with (acute) exacerbation: Secondary | ICD-10-CM | POA: Diagnosis not present

## 2016-07-03 DIAGNOSIS — L600 Ingrowing nail: Secondary | ICD-10-CM | POA: Diagnosis not present

## 2016-07-13 DIAGNOSIS — H25813 Combined forms of age-related cataract, bilateral: Secondary | ICD-10-CM | POA: Diagnosis not present

## 2016-07-18 ENCOUNTER — Ambulatory Visit: Payer: PPO | Admitting: Family Medicine

## 2016-07-18 ENCOUNTER — Ambulatory Visit (HOSPITAL_BASED_OUTPATIENT_CLINIC_OR_DEPARTMENT_OTHER)
Admission: RE | Admit: 2016-07-18 | Discharge: 2016-07-18 | Disposition: A | Discharge status: Home / Self Care | Payer: PPO | Source: Ambulatory Visit | Attending: Family Medicine | Admitting: Family Medicine

## 2016-07-18 ENCOUNTER — Encounter: Payer: Self-pay | Admitting: Family Medicine

## 2016-07-18 ENCOUNTER — Ambulatory Visit (INDEPENDENT_AMBULATORY_CARE_PROVIDER_SITE_OTHER): Payer: PPO | Admitting: Family Medicine

## 2016-07-18 VITALS — BP 136/80 | HR 86 | Ht 68.0 in | Wt 165.0 lb

## 2016-07-18 DIAGNOSIS — M25531 Pain in right wrist: Secondary | ICD-10-CM | POA: Diagnosis not present

## 2016-07-18 DIAGNOSIS — M21931 Unspecified acquired deformity of right forearm: Secondary | ICD-10-CM | POA: Insufficient documentation

## 2016-07-18 NOTE — Progress Notes (Signed)
PCP: Charlotte Sanes, MD  Subjective:   HPI: Patient is a 76 y.o. male here for right wrist pain.  Patient reports he's had about 2-3 years of right wrist pain. Pain level 5/10 but up to 10/10 and sharp with movements. Pain is a burning as well. Wrist brace has not helped. Tried aspercreme, intraarticular injection and no benefit. Told may be carpal tunnel, rheumatoid arthritis. Of note he broke 2 bones in this wrist back in 1960. Remotely had NCVs/EMGs. Pain is dorsal primarily. Was red, puffy yesterday but not currently. No numbness but can get burning into all fingers.  Past Medical History:  Diagnosis Date  . AAA (abdominal aortic aneurysm) (HCC)    S/P AAA repair. Infra renal AAA measures 4.0 cm in August of 2012  . Coronary artery disease    has known single vessel disease. Unsuccessful PCI to RCA in 2010. Left systsem with moderate calcifications but no obstructive disease and left to right collaterals.   [name] DVT (deep venous thrombosis) ([place])    post lumbar surgery  . Holter monitor, abnormal October 2012   with only rare PVC and PAC, NO pauses, No significant bradycardia  . Hypercholesterolemia   . Hypertension   . 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 Indigestion   . ITP (idiopathic thrombocytopenic purpura)   . Nausea   . Normal nuclear stress test October 2012   EF 72% with no ischemia  . PAD (peripheral artery disease) ([place])    followed by Dr. Scot Dock  . Renal artery stenosis (HCC)    Left per cardiac cath in 2010  . Thoracic aortic aneurysm ([place])    followed by Dr. Cyndia Bent  . Thrombocytopenia ([place])     Current Outpatient Prescriptions on File Prior to Visit  Medication Sig Dispense Refill  . cetirizine (ZYRTEC) 10 MG tablet Take 10 mg by mouth as needed.      . clobetasol cream (TEMOVATE) 0.05 % as needed.    . diazepam (VALIUM) 5 MG tablet Take 2.5 mg by mouth at bedtime as needed.    . doxazosin (CARDURA) 8 MG tablet Take 1 tablet (8 mg  total) by mouth at bedtime. 90 tablet 3  . propranolol (INDERAL) 40 MG tablet Take 1 tablet (40 mg total) by mouth 3 (three) times daily. 270 tablet 3   No current facility-administered medications on file prior to visit.     Past Surgical History:  Procedure Laterality Date  . ABDOMINAL AORTIC ANEURYSM REPAIR    . CARDIAC CATHETERIZATION  2010   EF 60% with single vessel CAD. He underwent attempt at PCI  with dissection and had successful stenting of the prox RCA but unsuccessful PCI to the mid RCA. Mild to moderate disease in the left system noted.   . CHOLECYSTECTOMY    . ILIAC ARTERY ANEURYSM REPAIR    . INGUINAL HERNIA REPAIR    . LUMBAR SPINE SURGERY    . ROTATOR CUFF REPAIR W/ DISTAL CLAVICLE EXCISION    . UMBILICAL HERNIA REPAIR      Allergies  Allergen Reactions  . Aspirin   . Codeine   . Compazine   . Fenofibrate   . Lovaza [Omega-3-Acid Ethyl Esters]   . Penicillins   . Zetia [Ezetimibe]     Social History   Social History  . Marital status: Married    Spouse name: N/A  . Number of children: 2  . Years of education: N/A   Occupational History  .  Museum/gallery conservator Retired    retired   Social History Main Topics  . Smoking status: Former Smoker    Quit date: 07/21/1990  . Smokeless tobacco: Never Used  . Alcohol use No  . Drug use: No  . Sexual activity: Not on file   Other Topics Concern  . Not on file   Social History Narrative  . No narrative on file    Family History  Problem Relation Age of Onset  . Diabetes Mother   . Diabetes Brother   . Coronary artery disease Brother     BP 136/80   Pulse 86   Ht 5\' 8"  (1.727 m)   Wt 165 lb (74.8 kg)   BMI 25.09 kg/m   Review of Systems: See HPI above.     Objective:  Physical Exam:  Gen: NAD, comfortable in exam room  Right wrist: No gross deformity, swelling, bruising, warmth, erythema. TTP over dorsal wrist joint and distal radioulnar joint.  No TTP over carpal tunnel, 1st CMC, 1st dorsal  compartment, TFCC. Lacks about 10 degrees extension compared to left.  FROM digits without pain. Sensation intact to light touch. Negative tinels, finkelsteins.   Assessment & Plan:  1. Right wrist pain - chronic.  Independently reviewed today's radiographs - noted angulation from prior fracture but this is healed.  ? Separation of DRUJ - and question deformity of medial radius projecting distally but only seen on one view so could be rotationally based.  We discussed injection (already had), bracing (which he has tried), occupational therapy, hand surgery referral to discuss possible options and/or MRI.  They would like to proceed with hand surgeon referral.  Tylenol if needed, reviewed some topical medications as well.  F/u prn.

## 2016-07-18 NOTE — Patient Instructions (Addendum)
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 You have tried bracing, injection without benefit. Occupational therapy is a consideration but I'd be afraid about this aggravating the problem. I would recommend evaluation with a hand surgeon to discuss potential surgical options - they may recommend MRI prior to this as well.

## 2016-07-20 ENCOUNTER — Telehealth: Payer: Self-pay | Admitting: Family Medicine

## 2016-07-20 NOTE — Telephone Encounter (Signed)
Patient's wife calling to get an update on future appointment with hand surgeon.

## 2016-07-20 NOTE — Telephone Encounter (Signed)
Let her know she should receive a phone call from them likely at the beginning of next week re: referral.

## 2016-07-23 DIAGNOSIS — M25531 Pain in right wrist: Secondary | ICD-10-CM | POA: Insufficient documentation

## 2016-07-23 NOTE — Assessment & Plan Note (Signed)
chronic.  Independently reviewed today's radiographs - noted angulation from prior fracture but this is healed.  ? Separation of DRUJ - and question deformity of medial radius projecting distally but only seen on one view so could be rotationally based.  We discussed injection (already had), bracing (which he has tried), occupational therapy, hand surgery referral to discuss possible options and/or MRI.  They would like to proceed with hand surgeon referral.  Tylenol if needed, reviewed some topical medications as well.  F/u prn.

## 2016-07-24 NOTE — Addendum Note (Signed)
Addended by: Sherrie George F on: 07/24/2016 08:16 AM   Modules accepted: Orders

## 2016-07-25 DIAGNOSIS — M19031 Primary osteoarthritis, right wrist: Secondary | ICD-10-CM | POA: Diagnosis not present

## 2016-07-27 ENCOUNTER — Ambulatory Visit: Payer: PPO | Admitting: Family Medicine

## 2016-08-03 DIAGNOSIS — M19031 Primary osteoarthritis, right wrist: Secondary | ICD-10-CM | POA: Diagnosis not present

## 2016-08-03 DIAGNOSIS — G8918 Other acute postprocedural pain: Secondary | ICD-10-CM | POA: Diagnosis not present

## 2016-08-06 ENCOUNTER — Ambulatory Visit: Payer: PPO | Admitting: Sports Medicine

## 2016-08-07 ENCOUNTER — Telehealth: Payer: Self-pay | Admitting: Cardiology

## 2016-08-07 NOTE — Telephone Encounter (Signed)
New message    Pt thinks he has blood clot in his leg from having hand surgery

## 2016-08-07 NOTE — Telephone Encounter (Signed)
Returned call to patient.He stated he had hand surgery this past Friday 08/03/16.Stated since then he is having swelling and numbess in left ankle.No pain.Stated he did not want to go to ED.He would like to see Dr.Jordan.Advised Dr.Jordan is out of office.Appointment scheduled with Bernerd Pho PA 08/08/16 at 8:00 am.

## 2016-08-07 NOTE — Progress Notes (Signed)
Cardiology Office Note    Date:  08/08/2016   ID:  Drew Cisneros, DOB 03-12-1940, MRN [id number]  PCP:  [name], DO  Cardiologist: Dr. Martinique  Chief Complaint  Patient presents with  . Leg Pain    History of Present Illness:    Drew Cisneros is a 76 y.o. male with past medical history of CAD (unsuccessful PCI to RCA in 2010 --> medically managed, NST in 2016 showing scar with no ischemia), enlarged thoracic aorta (followed by CT Surgery), ITP, HTN, HLD, and history of post-operative DVT (occuring 3+ years ago) who presents to the office for follow-up.   He was last examined by Dr. Martinique in 03/2015 and reported doing well from a cardiac perspective at that time. His Inderal was switched to Lopressor but he was unable to tolerate this.   He called the office on 08/07/2016 and reported swelling and numbness along his left ankle since having hand surgery on 08/03/2016. Was concerned about a blood clot and was added on for an acute visit today.   The patient reports having surgery on his right wrist last Friday. Prior to surgery he was in his usual state of health and denied any acute symptoms. Starting yesterday morning, he noted pain along his left lower calf. His wife reports his entire leg was tender to palpation and red in color. Ankle was swollen and felt warm to touch. He reports his symptoms were similar to when he had a prior DVT 3+ years ago and they are concerned about a recurrent clot.  He denies any recent chest discomfort, palpitations, lightheadedness, dizziness, presyncope, dyspnea, orthopnea or PND.   Past Medical History:  Diagnosis Date  . AAA (abdominal aortic aneurysm) (HCC)    S/P AAA repair. Infra renal AAA measures 4.0 cm in August of 2012  . Coronary artery disease    a. Unsuccessful PCI to RCA in 2010 --> medically managed. b. 2016: NST showing no evidence of ischemia  . DVT (deep venous thrombosis) ([place])    post lumbar surgery  . Holter monitor,  abnormal October 2012   with only rare PVC and PAC, NO pauses, No significant bradycardia  . Hypercholesterolemia   . Hypertension   . 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 Indigestion   . ITP (idiopathic thrombocytopenic purpura)   . Nausea   . Normal nuclear stress test October 2012   EF 72% with no ischemia  . PAD (peripheral artery disease) ([place])    followed by Dr. Scot Dock  . Renal artery stenosis (HCC)    Left per cardiac cath in 2010  . Thoracic aortic aneurysm ([place])    followed by Dr. Cyndia Bent  . Thrombocytopenia ([place])     Past Surgical History:  Procedure Laterality Date  . ABDOMINAL AORTIC ANEURYSM REPAIR    . CARDIAC CATHETERIZATION  2010   EF 60% with single vessel CAD. He underwent attempt at PCI  with dissection and had successful stenting of the prox RCA but unsuccessful PCI to the mid RCA. Mild to moderate disease in the left system noted.   . CHOLECYSTECTOMY    . ILIAC ARTERY ANEURYSM REPAIR    . INGUINAL HERNIA REPAIR    . LUMBAR SPINE SURGERY    . ROTATOR CUFF REPAIR W/ DISTAL CLAVICLE EXCISION    . UMBILICAL HERNIA REPAIR      Current Medications: Outpatient Medications Prior to Visit  Medication Sig Dispense Refill  . cetirizine (ZYRTEC)  10 MG tablet Take 10 mg by mouth as needed.      . clobetasol cream (TEMOVATE) 0.05 % as needed.    . diazepam (VALIUM) 5 MG tablet Take 2.5 mg by mouth at bedtime as needed.    . doxazosin (CARDURA) 8 MG tablet Take 1 tablet (8 mg total) by mouth at bedtime. 90 tablet 3  . propranolol (INDERAL) 40 MG tablet Take 1 tablet (40 mg total) by mouth 3 (three) times daily. 270 tablet 3   No facility-administered medications prior to visit.      Allergies:   Aspirin; Codeine; Compazine; Fenofibrate; Lovaza [omega-3-acid ethyl esters]; Penicillins; and Zetia [ezetimibe]   Social History   Social History  . Marital status: Married    Spouse name: N/A  . Number of children: 2  . Years of education: N/A    Occupational History  . Museum/gallery conservator Retired    retired   Social History Main Topics  . Smoking status: Former Smoker    Quit date: 07/21/1990  . Smokeless tobacco: Never Used  . Alcohol use No  . Drug use: No  . Sexual activity: Not Asked   Other Topics Concern  . None   Social History Narrative  . None     Family History:  The patient's family history includes Coronary artery disease in his brother; Diabetes in his brother and mother.   Review of Systems:   Please see the history of present illness.     General:  No chills, fever, night sweats or weight changes.  Cardiovascular:  No chest pain, dyspnea on exertion, orthopnea, palpitations, paroxysmal nocturnal dyspnea. Positive for edema and leg pain.  Dermatological: No rash, lesions/masses Respiratory: No cough, dyspnea Urologic: No hematuria, dysuria Abdominal:   No nausea, vomiting, diarrhea, bright red blood per rectum, melena, or hematemesis Neurologic:  No visual changes, wkns, changes in mental status. All other systems reviewed and are otherwise negative except as noted above.   Physical Exam:    VS:  BP 138/86 (BP Location: Left Arm, Patient Position: Sitting)   Pulse 80   Ht 5\' 8"  (1.727 m)   Wt 177 lb (80.3 kg)   BMI 26.91 kg/m    General: Well developed, well nourished Caucasian male appearing in no acute distress. Head: Normocephalic, atraumatic, sclera non-icteric, no xanthomas, nares are without discharge.  Neck: No carotid bruits. JVD not elevated.  Lungs: Respirations regular and unlabored, without wheezes or rales.  Heart: Regular rate and rhythm. No S3 or S4.  No murmur, no rubs, or gallops appreciated. Abdomen: Soft, non-tender, non-distended with normoactive bowel sounds. No hepatomegaly. No rebound/guarding. No obvious abdominal masses. Msk:  Strength and tone appear normal for age. No joint deformities or effusions. Extremities: No clubbing or cyanosis. Trace edema along left ankle. No  palpable cord noted. Distal pedal pulses are 2+ bilaterally. Neuro: Alert and oriented X 3. Moves all extremities spontaneously. No focal deficits noted. Psych:  Responds to questions appropriately with a normal affect. Skin: No rashes or lesions noted  Wt Readings from Last 3 Encounters:  08/08/16 177 lb (80.3 kg)  07/18/16 165 lb (74.8 kg)  03/21/15 168 lb (76.2 kg)     Studies/Labs Reviewed:   EKG:  EKG is ordered today.  The ekg ordered today demonstrates NSR, HR 80, with no acute ST or T-wave changes.   Recent Labs: No results found for requested labs within last 8760 hours.   Lipid Panel    Component Value Date/Time  CHOL 115 07/25/2010 0816   TRIG 481.0 (H) 07/25/2010 0816   HDL 23.70 (L) 07/25/2010 0816   CHOLHDL 5 07/25/2010 0816   VLDL 96.2 (H) 07/25/2010 0816   LDLDIRECT 38.3 07/25/2010 0816    Additional studies/ records that were reviewed today include:   NST: 03/2014 Impression Exercise Capacity:  Lexiscan with no exercise. BP Response:  Hypotensive blood pressure response. Clinical Symptoms:  No significant symptoms noted. ECG Impression:  No significant ECG changes with Lexiscan. Comparison with Prior Nuclear Study: No previous nuclear study performed  Overall Impression:  Low risk stress nuclear study with small, mild inferolateral bowel attenuation artifact.  LV Wall Motion:  NL LV Function; NL Wall Motion; EF 67%  Assessment:    1. Left leg pain   2. History of DVT (deep vein thrombosis)   3. Coronary artery disease involving native coronary artery of native heart without angina pectoris   4. Essential hypertension   5. Enlarged thoracic aorta ([place])   6. Chronic ITP (idiopathic thrombocytopenia) (HCC)      Plan:   In order of problems listed above:  1.Leg Pain/ History of DVT - underwent surgery 5 days ago but starting yesterday morning, he noted pain along his left lower calf with associated tenderness to palpation and erythema. Was  swollen and felt warm to touch. He reports his symptoms were similar to when he had a prior DVT 3+ years ago and they are concerned about a recurrent clot. - he denies any respiratory symptoms.  - will plan for a STAT lower extremity doppler to be performed in the office today to rule-out a recurrent DVT.  ADDENDUM: Prior to the study being obtained, the patient called his insurance company and found out his bill for the study would be $75. He then refused to have the study. The risks associated with an untreated DVT including a PE and possible death were reviewed with the patient thoroughly by the nurse Lattie Haw) as I was in a patient's room when he refused the test. I attempted to call the patient and there was no answer. I called the patient's wife within 30 minutes and reviewed with her the importance of the study. She voiced understanding of this and wished for him to have it but said he continued to refuse due to the cost. She reported she will monitor him closely and call if symptoms progress. Was informed to take him to the ED if he develops respiratory issues. I still recommended he have a doppler study performed STAT at either our office or a different office but she said she would call us if the patient changed his mind and wished to follow through with the study.   2. CAD - unsuccessful PCI to RCA in 2010 which was medically managed. NST in 2016 showed scar with no ischemia.  - he denies any recent anginal symptoms. - continue Propranolol. Intolerant to statin therapy.  3. HTN - BP is stable at 138/86 during today's visit.  - continue current medication regimen.   4. Enlarged Thoracic Aorta - followed by CT Surgery.   5. ITP - he denies any evidence of active bleeding.  - platelet count variable from 70K - 90K per the patient's report.   Medication Adjustments/Labs and Tests Ordered: Current medicines are reviewed at length with the patient today.  Concerns regarding medicines are  outlined above.  Medication changes, Labs and Tests ordered today are listed in the Patient Instructions below. Patient Instructions  Medication Instructions: No  changes  Procedures/Testing: A lower extremity venous doppler has been ordered. You have been put on the schedule today.  Follow-Up: Your physician wants you to follow-up in: 6 months with Dr. Martinique. You will receive a reminder letter in the mail two months in advance. If you don't receive a letter, please call our office to schedule the follow-up appointment.  If you need a refill on your cardiac medications before your next appointment, please call your pharmacy.    Signed, Erma Heritage, PA-C  08/08/2016 12:58 PM    [place], [address] [place], [place]  [address] Phone: [phone]; Fax: [phone]  [address] [place], [place] [address] Phone: [phone]

## 2016-08-08 ENCOUNTER — Ambulatory Visit (INDEPENDENT_AMBULATORY_CARE_PROVIDER_SITE_OTHER): Payer: PPO | Admitting: Student

## 2016-08-08 ENCOUNTER — Encounter (HOSPITAL_COMMUNITY): Payer: PPO

## 2016-08-08 ENCOUNTER — Encounter: Payer: Self-pay | Admitting: Student

## 2016-08-08 VITALS — BP 138/86 | HR 80 | Ht 68.0 in | Wt 177.0 lb

## 2016-08-08 DIAGNOSIS — D693 Immune thrombocytopenic purpura: Secondary | ICD-10-CM

## 2016-08-08 DIAGNOSIS — I7789 Other specified disorders of arteries and arterioles: Secondary | ICD-10-CM | POA: Diagnosis not present

## 2016-08-08 DIAGNOSIS — I10 Essential (primary) hypertension: Secondary | ICD-10-CM

## 2016-08-08 DIAGNOSIS — Z86718 Personal history of other venous thrombosis and embolism: Secondary | ICD-10-CM | POA: Insufficient documentation

## 2016-08-08 DIAGNOSIS — M79605 Pain in left leg: Secondary | ICD-10-CM

## 2016-08-08 DIAGNOSIS — I2510 Atherosclerotic heart disease of native coronary artery without angina pectoris: Secondary | ICD-10-CM | POA: Diagnosis not present

## 2016-08-08 NOTE — Addendum Note (Signed)
Addended by: Ricci Barker on: 08/08/2016 04:05 PM   Modules accepted: Orders

## 2016-08-08 NOTE — Patient Instructions (Addendum)
Medication Instructions: No changes   Procedures/Testing: A lower extremity venous doppler has been ordered. You have been put on the schedule today.  Follow-Up: Your physician wants you to follow-up in: 6 months with Dr. Martinique. You will receive a reminder letter in the mail two months in advance. If you don't receive a letter, please call our office to schedule the follow-up appointment.   If you need a refill on your cardiac medications before your next appointment, please call your pharmacy.

## 2016-08-09 DIAGNOSIS — M19031 Primary osteoarthritis, right wrist: Secondary | ICD-10-CM | POA: Diagnosis not present

## 2016-08-14 DIAGNOSIS — M19031 Primary osteoarthritis, right wrist: Secondary | ICD-10-CM | POA: Diagnosis not present

## 2016-08-14 DIAGNOSIS — M25531 Pain in right wrist: Secondary | ICD-10-CM | POA: Diagnosis not present

## 2016-08-20 ENCOUNTER — Telehealth: Payer: Self-pay | Admitting: Cardiology

## 2016-08-20 NOTE — Telephone Encounter (Deleted)
Called the patient back to check on him. He stated that he had an appointment tomorrow to be seen. He did state that

## 2016-08-20 NOTE — Telephone Encounter (Signed)
Returned the phone call to the patient. He stated that he "was not good" but would not elaborate. The first available with Dr. Martinique was in August. He refused to see a APP. He was again asked what his symptoms were but would not elaborate. He stated that he would have to find someone else to see.   The patient had recently been seen by a PA and was ordered a vascular study for a possible DVT (08/08/16) After finding out the cost would be $75, he refused to have the study done (after extensive education on the importance of it.)   Prior to the study being obtained, the patient called his insurance company and found out his bill for the study would be $75. He then refused to have the study. The risks associated with an untreated DVT including a PE and possible death were reviewed with the patient thoroughly by the nurse Lattie Haw) as I was in a patient's room when he refused the test. I attempted to call the patient and there was no answer. I called the patient's wife within 30 minutes and reviewed with her the importance of the study. She voiced understanding of this and wished for him to have it but said he continued to refuse due to the cost. She reported she will monitor him closely and call if symptoms progress. Was informed to take him to the ED if he develops respiratory issues. I still recommended he have a doppler study performed STAT at either our office or a different office but she said she would call us if the patient changed his mind and wished to follow through with the study.   He was asked if he needed to be seen at the emergency room and he stated that he did not. He was advised that if may be best to be seen in the ED, especially if his symptoms were the same from his previous visit, but again refused. He said he had a PCP that he could go see and then he hung up the phone.

## 2016-08-20 NOTE — Telephone Encounter (Signed)
New message   Patient states he does not want to see a APP again only Dr. Martinique  Pt c/o swelling: STAT is pt has developed SOB within 24 hours  1. How long have you been experiencing swelling? Yesterday - worse this am   2. Where is the swelling located? Foot , ankles   3.  Are you currently taking a "fluid pill"? No   4.  Are you currently SOB? No   5.  Have you traveled recently? No

## 2016-08-20 NOTE — Telephone Encounter (Signed)
Called the patient back to check on him. He stated that he had an appointment tomorrow to be seen by his physician. He did state that the swelling in his leg was not any better and he felt overall "bad" He was advised that once again he should go to the ED to be assessed but he declined and stated that he would wait for his appointment. He was educated on the severity of DVT's if he possibly had one. Will route to the provider for his knowledge.

## 2016-08-21 DIAGNOSIS — M19031 Primary osteoarthritis, right wrist: Secondary | ICD-10-CM | POA: Diagnosis not present

## 2016-08-21 DIAGNOSIS — M25531 Pain in right wrist: Secondary | ICD-10-CM | POA: Diagnosis not present

## 2016-08-27 DIAGNOSIS — M25531 Pain in right wrist: Secondary | ICD-10-CM | POA: Diagnosis not present

## 2016-08-27 DIAGNOSIS — M19031 Primary osteoarthritis, right wrist: Secondary | ICD-10-CM | POA: Diagnosis not present

## 2016-09-11 DIAGNOSIS — M25531 Pain in right wrist: Secondary | ICD-10-CM | POA: Diagnosis not present

## 2016-09-11 DIAGNOSIS — M2531 Other instability, shoulder: Secondary | ICD-10-CM | POA: Diagnosis not present

## 2016-09-11 DIAGNOSIS — M19031 Primary osteoarthritis, right wrist: Secondary | ICD-10-CM | POA: Diagnosis not present

## 2016-09-17 DIAGNOSIS — M19031 Primary osteoarthritis, right wrist: Secondary | ICD-10-CM | POA: Diagnosis not present

## 2016-09-17 DIAGNOSIS — M25531 Pain in right wrist: Secondary | ICD-10-CM | POA: Diagnosis not present

## 2016-09-19 ENCOUNTER — Telehealth: Payer: Self-pay | Admitting: Cardiology

## 2016-09-19 NOTE — Telephone Encounter (Signed)
Returned call to patient He states his BP was 154/101 today and BP yesterday 148/80 Patient states HR is in he 80s Patient checks BP during the day when he has symptoms He states he takes propranolol 4x/daily instead of 3x/daily as Rx'ed - he has been taking 4 a day since Saturday 7/14 He states he was told he has an aneurysm in the top of his heart and has to keep BP low He is concerned that he is getting immune to propranolol He states when his BP is elevated, he gets hot around his head and his eyes & uses ice packs to get his BP down He states when he tries to do "much of anything" that he has excessive sweating - this has been going on since early 2018 Patient denies chest pain, shortness of breath  Advised that patient check BP at home about 1 hour after AM meds and 1 hour after PM meds. Check with feet/legs uncrossed, in a sitting position, after he has been resting for about 5-10 mins. Advised to bring list of readings + home cuff to 7/23 appt w/Dr. Martinique. Informed him I will send a message to MD for advice

## 2016-09-19 NOTE — Telephone Encounter (Signed)
Yes we will review BP therapy with him next week  Deborra Phegley Martinique MD, [facility]

## 2016-09-19 NOTE — Telephone Encounter (Signed)
New message  Wife called in   Pt c/o BP issue: STAT if pt c/o blurred vision, one-sided weakness or slurred speech  1. What are your last 5 BP readings?  Just knows its high 154/101 (per patient)  2. Are you having any other symptoms (ex. Dizziness, headache, blurred vision, passed out)? no  3. What is your BP issue?  Taking extra bp medication , and its not coming down , feels hot around neck and head (per patient) its waking him up at night

## 2016-09-20 NOTE — Telephone Encounter (Signed)
Returned call to patient.Unable to reach patient,all phone numbers listed in chart changed or disconnected.

## 2016-09-24 ENCOUNTER — Encounter: Payer: Self-pay | Admitting: Cardiology

## 2016-09-24 ENCOUNTER — Ambulatory Visit (INDEPENDENT_AMBULATORY_CARE_PROVIDER_SITE_OTHER): Payer: PPO | Admitting: Cardiology

## 2016-09-24 VITALS — BP 140/91 | HR 80 | Ht 68.0 in | Wt 171.8 lb

## 2016-09-24 DIAGNOSIS — R0609 Other forms of dyspnea: Secondary | ICD-10-CM | POA: Diagnosis not present

## 2016-09-24 DIAGNOSIS — I7789 Other specified disorders of arteries and arterioles: Secondary | ICD-10-CM

## 2016-09-24 DIAGNOSIS — I2510 Atherosclerotic heart disease of native coronary artery without angina pectoris: Secondary | ICD-10-CM | POA: Diagnosis not present

## 2016-09-24 DIAGNOSIS — R29898 Other symptoms and signs involving the musculoskeletal system: Secondary | ICD-10-CM

## 2016-09-24 DIAGNOSIS — I10 Essential (primary) hypertension: Secondary | ICD-10-CM | POA: Diagnosis not present

## 2016-09-24 DIAGNOSIS — R0600 Dyspnea, unspecified: Secondary | ICD-10-CM

## 2016-09-24 MED ORDER — CARVEDILOL 25 MG PO TABS: 25.0000 mg | ORAL_TABLET | Freq: Two times a day (BID) | ORAL | 11 refills | Status: DC

## 2016-09-24 NOTE — Progress Notes (Signed)
Drew Cisneros Date of Birth: 06-13-1940   History of Present Illness: Drew Cisneros is seen today for follow up of CAD. He has a history of coronary disease. In 2010 we attempted to perform a PCI on the RCA. This was unsuccessful. He has been managed medically since then and is doing well. Last Myoview in  2016 showed a small inferior scar without ischemia and EF 67%.  He also has enlargement of the thoracic aorta and  is followed every other year by Dr. Cyndia Bent. He last CT in 2015 was stable at 3.9cm. He has hypertriglyceridemia but is intolerant of multiple lipid lowering drugs including fenofibrate, Trilipix, crestor, Zocor, Mevacor, and Lovaza. Also intolerant of metoprolol.  Complains of ED.  He was seen in June with swelling in his left leg/ankle following hand surgery. There was concern for possible DVT given prior history and venous doppler ordered but patient refused to have this done due to cost.   On follow up today he reports he is not doing well. Notes he has no energy and breaks out in a sweat easily. No chest pain. Rare acid reflux. Some DOE but has been told he has COPD. BP fluctuates 177-939 systolic and diastolic 03-00. Pulse rate 80-100. Left ankle swelling resolved.   Current Outpatient Prescriptions on File Prior to Visit  Medication Sig Dispense Refill  . cetirizine (ZYRTEC) 10 MG tablet Take 10 mg by mouth as needed.      . clobetasol cream (TEMOVATE) 0.05 % as needed.    . diazepam (VALIUM) 5 MG tablet Take 2.5 mg by mouth at bedtime as needed.    . doxazosin (CARDURA) 8 MG tablet Take 1 tablet (8 mg total) by mouth at bedtime. 90 tablet 3   No current facility-administered medications on file prior to visit.     Allergies  Allergen Reactions  . Aspirin   . Codeine   . Compazine   . Fenofibrate   . Lovaza [Omega-3-Acid Ethyl Esters]   . Penicillins   . Zetia [Ezetimibe]     Past Medical History:  Diagnosis Date  . AAA (abdominal aortic aneurysm) (HCC)    S/P  AAA repair. Infra renal AAA measures 4.0 cm in August of 2012  . Coronary artery disease    a. Unsuccessful PCI to RCA in 2010 --> medically managed. b. 2016: NST showing no evidence of ischemia  . DVT (deep venous thrombosis) ([place])    post lumbar surgery  . Holter monitor, abnormal October 2012   with only rare PVC and PAC, NO pauses, No significant bradycardia  . Hypercholesterolemia   . Hypertension   . 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 Indigestion   . ITP (idiopathic thrombocytopenic purpura)   . Nausea   . Normal nuclear stress test October 2012   EF 72% with no ischemia  . PAD (peripheral artery disease) ([place])    followed by Dr. Scot Dock  . Renal artery stenosis (HCC)    Left per cardiac cath in 2010  . Thoracic aortic aneurysm ([place])    followed by Dr. Cyndia Bent  . Thrombocytopenia ([place])     Past Surgical History:  Procedure Laterality Date  . ABDOMINAL AORTIC ANEURYSM REPAIR    . CARDIAC CATHETERIZATION  2010   EF 60% with single vessel CAD. He underwent attempt at PCI  with dissection and had successful stenting of the prox RCA but unsuccessful PCI to the mid RCA. Mild to moderate disease in  the left system noted.   . CHOLECYSTECTOMY    . ILIAC ARTERY ANEURYSM REPAIR    . INGUINAL HERNIA REPAIR    . LUMBAR SPINE SURGERY    . ROTATOR CUFF REPAIR W/ DISTAL CLAVICLE EXCISION    . UMBILICAL HERNIA REPAIR      History  Smoking Status  . Former Smoker  . Quit date: 07/21/1990  Smokeless Tobacco  . Never Used    History  Alcohol Use No    Family History  Problem Relation Age of Onset  . Diabetes Mother   . Diabetes Brother   . Coronary artery disease Brother     Review of Systems: The review of systems is per the HPI.  All other systems were reviewed and are negative.  Physical Exam: BP (!) 140/91   Pulse 80   Ht 5\' 8"  (1.727 m)   Wt 171 lb 12.8 oz (77.9 kg)   SpO2 96%   BMI 26.12 kg/m   He is a WDWM,  alert and in no acute  distress.Skin is warm and dry. Color is normal.  HEENT is unremarkable. Normocephalic/atraumatic. PERRL. Sclera are nonicteric. Neck is supple. No masses. No JVD. Lungs are clear. Cardiac exam shows a regular rate and rhythm. He has no gallops, murmurs, or clicks. PMI is normal. Abdomen is soft. Extremities are without edema. Pulses are 2+. Gait and ROM are intact. No gross neurologic deficits noted.  LABORATORY DATA:  Labs reviewed from primary care in December 2017. plts 67K. Triglycerides 578. Otherwise chemistries normal. A1c 5.5%.    Assessment / Plan: 1. Coronary disease with history of failed PCI of the RCA in 2010. Otherwise nonobstructive disease. He has no anginal symptoms.  We will continue medical management with beta blocker. Myoview study in Jan. 2016 showed a small inferolateral scar without ischemia. 2. Dyslipidemia with predominant hypertriglyceridemia.  Intolerant of multiple drugs and fish oil. Stressed importance of dietary modifications with weight loss. Avoid simple carbs and sweets.  3. Thoracic aortic aneurysm. 3.9 cm. Asymptomatic. 4. Hypertension-well controlled. 5. History of ITP. 6. PVCs. Asymptomatic.   It is unclear why he feels so much fatigue. I think some of this may be related to his Inderal which crosses the blood brain barrier. Intolerant of metoprolol in past. Will try and switch to Coreg 25 mg bid. I will follow up in 6 months.

## 2016-09-24 NOTE — Patient Instructions (Signed)
We will try switching Inderal to Coreg (carvedilol) 25 mg twice   Continue your other therapy

## 2016-09-25 ENCOUNTER — Telehealth: Payer: Self-pay | Admitting: Cardiology

## 2016-09-25 NOTE — Telephone Encounter (Signed)
Returned call, answered questions regarding coreg and concerns regarding potential SE's. Instructed patient to take med as Dr. Martinique advised, call if other concerns or questions. He verbalized understanding and thanks for call.

## 2016-09-25 NOTE — Telephone Encounter (Signed)
Please call,concerning the prescription(Carvedilol) Dr Martinique gave him yesterday.

## 2016-09-27 ENCOUNTER — Telehealth: Payer: Self-pay | Admitting: Cardiology

## 2016-09-27 NOTE — Telephone Encounter (Signed)
His BP and HR are OK. We switched him from propranolol to Coreg to see if this will help with his fatigue. If he wants to go back on propranolol that is ok.  Jailynne Opperman Martinique MD, [facility]

## 2016-09-27 NOTE — Telephone Encounter (Signed)
New message    Pt c/o medication issue:  1. Name of Medication: carvedilol (COREG) 25 MG tablet  2. How are you currently taking this medication (dosage and times per day)? 25mg   3. Are you having a reaction (difficulty breathing--STAT)? no  4. What is your medication issue? Pt states its not bringing down his BP like its supposed to. He states that the first day that he took it he was feeling down and his heart rate had rose. He wants to switch to something else that works. He states that he has a kidney dysfunction and he is concerned.

## 2016-09-27 NOTE — Telephone Encounter (Signed)
Returned call to patient Dr.Jordan's advice given.Advised to call back if needed.

## 2016-09-27 NOTE — Telephone Encounter (Signed)
Returned call to patient. He is concerned about Coreg not bringing his BP and/or HR down. He saw MD 7/23 and mes were changed. Patient states MD advised he check BP in the morning and at 5pm - readings below. He states last night he took 75mg  carvedilol and did not take any today - he went back to taking propranolol today - 40mg  @ 7am and @ 3pm. He reports feeling fatigued. He states he has aneurysms in/around his heart and has to keep his HR under control  Patient does not want to take carvedilol and he wants to know if MD can increase propranolol dose He states his BP has been elevated since he had wrist surgery on June 1  HR readings: 83, 89, 92, 86, 89, 93   BP readings: 130/80 138/88 138/86 132/79 121/72 126/76 120/77   Advised would have consult MD and seek recommendations - message sent to Dr. Cheri Kearns, LPN

## 2016-10-23 DIAGNOSIS — M19031 Primary osteoarthritis, right wrist: Secondary | ICD-10-CM | POA: Diagnosis not present

## 2016-11-14 DIAGNOSIS — D696 Thrombocytopenia, unspecified: Secondary | ICD-10-CM | POA: Diagnosis not present

## 2016-11-14 DIAGNOSIS — I10 Essential (primary) hypertension: Secondary | ICD-10-CM | POA: Diagnosis not present

## 2016-11-14 DIAGNOSIS — Z0000 Encounter for general adult medical examination without abnormal findings: Secondary | ICD-10-CM | POA: Diagnosis not present

## 2016-11-14 DIAGNOSIS — E785 Hyperlipidemia, unspecified: Secondary | ICD-10-CM | POA: Diagnosis not present

## 2016-11-14 DIAGNOSIS — Z125 Encounter for screening for malignant neoplasm of prostate: Secondary | ICD-10-CM | POA: Diagnosis not present

## 2017-01-30 DIAGNOSIS — R000 Tachycardia, unspecified: Secondary | ICD-10-CM | POA: Diagnosis not present

## 2017-01-30 DIAGNOSIS — I10 Essential (primary) hypertension: Secondary | ICD-10-CM | POA: Diagnosis not present

## 2017-03-27 NOTE — Progress Notes (Signed)
Drew Cisneros Date of Birth: 07-01-1940   History of Present Illness: Drew Cisneros is seen today for follow up of CAD. He has a history of coronary disease. In 2010 we attempted to perform a PCI on the RCA. This was unsuccessful. He has been managed medically since then and is doing well. Last Myoview in  2016 showed a small inferior scar without ischemia and EF 67%.  He also has enlargement of the thoracic aorta and  is followed every other year by Dr. Cyndia Bent. He last CT in 2015 was stable at 3.9cm. He has hypertriglyceridemia but is intolerant of multiple lipid lowering drugs including fenofibrate, Trilipix, crestor, Zocor, Mevacor, and Lovaza. Also intolerant of metoprolol.   He was seen in June 2018 with swelling in his left leg/ankle following hand surgery. There was concern for possible DVT given prior history and venous doppler ordered but patient refused to have this done due to cost.   On follow up today he reports he is doing OK.  He still  has poor  energy and breaks out in a sweat easily. No chest pain. He is getting ready [place].   Current Outpatient Medications on File Prior to Visit  Medication Sig Dispense Refill  . cetirizine (ZYRTEC) 10 MG tablet Take 10 mg by mouth as needed.      . diazepam (VALIUM) 5 MG tablet Take 2.5 mg by mouth at bedtime as needed.    . doxazosin (CARDURA) 8 MG tablet Take 1 tablet (8 mg total) by mouth at bedtime. 90 tablet 3  . propranolol (INDERAL) 40 MG tablet Take 40 mg twice a day 60 tablet 6   No current facility-administered medications on file prior to visit.     Allergies  Allergen Reactions  . Aspirin   . Codeine   . Compazine   . Fenofibrate   . Lovaza [Omega-3-Acid Ethyl Esters]   . Penicillins   . Zetia [Ezetimibe]     Past Medical History:  Diagnosis Date  . AAA (abdominal aortic aneurysm) (HCC)    S/P AAA repair. Infra renal AAA measures 4.0 cm in August of 2012  . Coronary  artery disease    a. Unsuccessful PCI to RCA in 2010 --> medically managed. b. 2016: NST showing no evidence of ischemia  . DVT (deep venous thrombosis) ([place])    post lumbar surgery  . Holter monitor, abnormal October 2012   with only rare PVC and PAC, NO pauses, No significant bradycardia  . Hypercholesterolemia   . Hypertension   . 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 Indigestion   . ITP (idiopathic thrombocytopenic purpura)   . Nausea   . Normal nuclear stress test October 2012   EF 72% with no ischemia  . PAD (peripheral artery disease) ([place])    followed by Dr. Scot Dock  . Renal artery stenosis (HCC)    Left per cardiac cath in 2010  . Thoracic aortic aneurysm ([place])    followed by Dr. Cyndia Bent  . Thrombocytopenia ([place])     Past Surgical History:  Procedure Laterality Date  . ABDOMINAL AORTIC ANEURYSM REPAIR    . CARDIAC CATHETERIZATION  2010   EF 60% with single vessel CAD. He underwent attempt at PCI  with dissection and had successful stenting of the prox RCA but unsuccessful PCI to the mid RCA. Mild to moderate disease in the left system noted.   [name]  CHOLECYSTECTOMY    . ILIAC ARTERY ANEURYSM REPAIR    . INGUINAL HERNIA REPAIR    . LUMBAR SPINE SURGERY    . ROTATOR CUFF REPAIR W/ DISTAL CLAVICLE EXCISION    . UMBILICAL HERNIA REPAIR      Social History   Tobacco Use  Smoking Status Former Smoker  . Last attempt to quit: 07/21/1990  . Years since quitting: 26.7  Smokeless Tobacco Never Used    Social History   Substance and Sexual Activity  Alcohol Use No    Family History  Problem Relation Age of Onset  . Diabetes Mother   . Diabetes Brother   . Coronary artery disease Brother     Review of Systems: The review of systems is per the HPI.  All other systems were reviewed and are negative.  Physical Exam: BP 132/74   Pulse 78   Ht 5\' 8"  (1.727 m)   Wt 165 lb (74.8 kg)   BMI 25.09 kg/m   GENERAL:  Well appearing WM HEENT:  PERRL,  EOMI, sclera are clear. Oropharynx is clear. NECK:  No jugular venous distention, carotid upstroke brisk and symmetric, no bruits, no thyromegaly or adenopathy LUNGS:  Clear to auscultation bilaterally CHEST:  Unremarkable HEART:  RRR,  PMI not displaced or sustained,S1 and S2 within normal limits, no S3, no S4: no clicks, no rubs, no murmurs ABD:  Soft, nontender. BS +, no masses or bruits. No hepatomegaly, no splenomegaly EXT:  2 + pulses throughout, no edema, no cyanosis no clubbing SKIN:  Warm and dry.  No rashes NEURO:  Alert and oriented x 3. Cranial nerves II through XII intact. PSYCH:  Cognitively intact    LABORATORY DATA:  Labs reviewed from primary care in December 2017. plts 67K. Triglycerides 578. Otherwise chemistries normal. A1c 5.5%.  Dated 11/14/16: cholesterol 134, triglycerides 367, HDL 23, LDL 45. Chemistries normal. Plts 66K. Hgb 15.1.   Assessment / Plan: 1. Coronary disease with history of failed PCI of the RCA in 2010. Otherwise nonobstructive disease. He has no anginal symptoms.  We will continue medical management with beta blocker. Myoview study in Jan. 2016 showed a small inferolateral scar without ischemia. 2. Dyslipidemia with predominant hypertriglyceridemia and low HDL.  Intolerant of multiple drugs and fish oil. Stressed importance of dietary modifications with weight loss. Avoid simple carbs and sweets.  3. Thoracic aortic aneurysm. 3.9 cm. Asymptomatic. 4. Hypertension-well controlled. 5. History of ITP. 6. PVCs. Asymptomatic.   Will follow up in one year.

## 2017-04-02 ENCOUNTER — Encounter: Payer: Self-pay | Admitting: Cardiology

## 2017-04-02 ENCOUNTER — Ambulatory Visit: Payer: PPO | Admitting: Cardiology

## 2017-04-02 VITALS — BP 132/74 | HR 78 | Ht 68.0 in | Wt 165.0 lb

## 2017-04-02 DIAGNOSIS — I2510 Atherosclerotic heart disease of native coronary artery without angina pectoris: Secondary | ICD-10-CM | POA: Diagnosis not present

## 2017-04-02 DIAGNOSIS — I10 Essential (primary) hypertension: Secondary | ICD-10-CM | POA: Diagnosis not present

## 2017-04-02 DIAGNOSIS — D693 Immune thrombocytopenic purpura: Secondary | ICD-10-CM

## 2017-04-02 MED ORDER — CLOBETASOL PROPIONATE 0.05 % EX CREA: TOPICAL_CREAM | Freq: Two times a day (BID) | CUTANEOUS | 3 refills | Status: DC

## 2017-04-02 NOTE — Patient Instructions (Addendum)
Continue your current therapy  I will see you in one year   

## 2017-04-16 DIAGNOSIS — L301 Dyshidrosis [pompholyx]: Secondary | ICD-10-CM | POA: Diagnosis not present

## 2017-04-16 DIAGNOSIS — L853 Xerosis cutis: Secondary | ICD-10-CM | POA: Diagnosis not present

## 2017-04-16 DIAGNOSIS — R233 Spontaneous ecchymoses: Secondary | ICD-10-CM | POA: Diagnosis not present

## 2017-04-16 DIAGNOSIS — L299 Pruritus, unspecified: Secondary | ICD-10-CM | POA: Diagnosis not present

## 2017-08-07 DIAGNOSIS — I10 Essential (primary) hypertension: Secondary | ICD-10-CM | POA: Diagnosis not present

## 2017-08-07 DIAGNOSIS — M542 Cervicalgia: Secondary | ICD-10-CM | POA: Diagnosis not present

## 2017-08-07 DIAGNOSIS — G4700 Insomnia, unspecified: Secondary | ICD-10-CM | POA: Diagnosis not present

## 2017-08-13 DIAGNOSIS — J441 Chronic obstructive pulmonary disease with (acute) exacerbation: Secondary | ICD-10-CM | POA: Diagnosis not present

## 2017-11-15 DIAGNOSIS — E785 Hyperlipidemia, unspecified: Secondary | ICD-10-CM | POA: Diagnosis not present

## 2017-11-15 DIAGNOSIS — Z125 Encounter for screening for malignant neoplasm of prostate: Secondary | ICD-10-CM | POA: Diagnosis not present

## 2017-11-15 DIAGNOSIS — Z0000 Encounter for general adult medical examination without abnormal findings: Secondary | ICD-10-CM | POA: Diagnosis not present

## 2017-11-15 DIAGNOSIS — I10 Essential (primary) hypertension: Secondary | ICD-10-CM | POA: Diagnosis not present

## 2018-01-14 DIAGNOSIS — M25571 Pain in right ankle and joints of right foot: Secondary | ICD-10-CM | POA: Diagnosis not present

## 2018-01-14 DIAGNOSIS — M79671 Pain in right foot: Secondary | ICD-10-CM | POA: Diagnosis not present

## 2018-01-20 ENCOUNTER — Ambulatory Visit: Payer: PPO | Admitting: Podiatry

## 2018-04-02 DIAGNOSIS — K635 Polyp of colon: Secondary | ICD-10-CM | POA: Diagnosis not present

## 2018-04-02 DIAGNOSIS — D124 Benign neoplasm of descending colon: Secondary | ICD-10-CM | POA: Diagnosis not present

## 2018-04-02 DIAGNOSIS — Z800 Family history of malignant neoplasm of digestive organs: Secondary | ICD-10-CM | POA: Diagnosis not present

## 2018-04-02 DIAGNOSIS — Z1211 Encounter for screening for malignant neoplasm of colon: Secondary | ICD-10-CM | POA: Diagnosis not present

## 2018-04-02 DIAGNOSIS — Z86010 Personal history of colonic polyps: Secondary | ICD-10-CM | POA: Diagnosis not present

## 2018-04-02 DIAGNOSIS — D125 Benign neoplasm of sigmoid colon: Secondary | ICD-10-CM | POA: Diagnosis not present

## 2018-04-29 DIAGNOSIS — M25571 Pain in right ankle and joints of right foot: Secondary | ICD-10-CM | POA: Diagnosis not present

## 2018-04-29 DIAGNOSIS — M109 Gout, unspecified: Secondary | ICD-10-CM | POA: Diagnosis not present

## 2018-05-02 DIAGNOSIS — L918 Other hypertrophic disorders of the skin: Secondary | ICD-10-CM | POA: Diagnosis not present

## 2018-05-02 DIAGNOSIS — L570 Actinic keratosis: Secondary | ICD-10-CM | POA: Diagnosis not present

## 2018-05-02 DIAGNOSIS — L821 Other seborrheic keratosis: Secondary | ICD-10-CM | POA: Diagnosis not present

## 2018-05-06 DIAGNOSIS — N529 Male erectile dysfunction, unspecified: Secondary | ICD-10-CM | POA: Diagnosis not present

## 2018-05-06 DIAGNOSIS — R413 Other amnesia: Secondary | ICD-10-CM | POA: Diagnosis not present

## 2018-05-06 DIAGNOSIS — D693 Immune thrombocytopenic purpura: Secondary | ICD-10-CM | POA: Diagnosis not present

## 2018-05-19 ENCOUNTER — Ambulatory Visit: Payer: PPO | Admitting: Podiatry

## 2018-05-21 ENCOUNTER — Encounter: Payer: Self-pay | Admitting: Sports Medicine

## 2018-05-21 ENCOUNTER — Other Ambulatory Visit: Payer: Self-pay

## 2018-05-21 ENCOUNTER — Ambulatory Visit: Payer: PPO | Admitting: Sports Medicine

## 2018-05-21 VITALS — BP 143/85 | HR 78 | Temp 97.3°F | Resp 16 | Ht 68.0 in | Wt 160.0 lb

## 2018-05-21 DIAGNOSIS — M79674 Pain in right toe(s): Secondary | ICD-10-CM

## 2018-05-21 DIAGNOSIS — L600 Ingrowing nail: Secondary | ICD-10-CM

## 2018-05-21 DIAGNOSIS — M2042 Other hammer toe(s) (acquired), left foot: Secondary | ICD-10-CM | POA: Diagnosis not present

## 2018-05-21 DIAGNOSIS — M79675 Pain in left toe(s): Secondary | ICD-10-CM

## 2018-05-21 DIAGNOSIS — M2041 Other hammer toe(s) (acquired), right foot: Secondary | ICD-10-CM

## 2018-05-21 NOTE — Patient Instructions (Signed)

## 2018-05-21 NOTE — Progress Notes (Signed)
Subjective: Drew Cisneros is a 78 y.o. male patient presents to office today complaining of a moderately painful incurvated, medial and lateral hallux nail borders of both big toes.  Patient reports that his big toenails tend to be very thick and reports that because his second toes press on the big toe and crossover because he has hammertoes this is always present as a problem.  Patient reports that about 3 to 4 years ago had nail procedures done where they did not kill off the nail root and with time the corners have grown back out into the skin.  Patient would like to talk about treatment options for ingrown toenails.  Patient during this visit also has several questions about hammertoes and reports full history to of a tendon tear on the left foot.  Patient denies any other complaints and has not been doing any other treatment at this time to help the big toenails besides an over-the-counter antifungal.  No other issues noted.  Review of Systems  All other systems reviewed and are negative.    Patient Active Problem List   Diagnosis Date Noted  . History of DVT (deep vein thrombosis) 08/08/2016  . Left leg pain 08/08/2016  . Right wrist pain 07/23/2016  . Healthcare maintenance 11/14/2015  . RLQ abdominal pain 01/08/2013  . Bradycardia 12/04/2010  . Idiopathic thrombocytopenic purpura ([place])   . Thrombocytopenia ([place])   . Nausea   . Indigestion   . Enlarged thoracic aorta ([place])   . Coronary artery disease   . PAD (peripheral artery disease) ([place])   . AAA (abdominal aortic aneurysm) ([place])   . Iliac aneurysm ([place])   . Hypercholesterolemia   . Hypertension   . Thoracic aortic aneurysm [facility])     Current Outpatient Medications on File Prior to Visit  Medication Sig Dispense Refill  . cetirizine (ZYRTEC) 10 MG tablet Take 10 mg by mouth as needed.      . clobetasol cream (TEMOVATE) 0.05 % Apply topically 2 (two) times daily. 30 g 3  . diazepam (VALIUM) 5 MG tablet Take 2.5 mg by  mouth at bedtime as needed.    . doxazosin (CARDURA) 8 MG tablet Take 1 tablet (8 mg total) by mouth at bedtime. 90 tablet 3  . propranolol (INDERAL) 40 MG tablet Take 40 mg twice a day 60 tablet 6   No current facility-administered medications on file prior to visit.     Allergies  Allergen Reactions  . Aspirin   . Atenolol Other (See Comments)    Ear issues  . Codeine   . Compazine   . Fenofibrate   . Lovaza [Omega-3-Acid Ethyl Esters]   . Penicillins   . Prednisone Other (See Comments)    Increase in BP  . Zetia [Ezetimibe]     Objective:  Vitals:   05/21/18 0858  Weight: 160 lb (72.6 kg)  Height: 5\' 8"  (1.727 m)    General: Well developed, nourished, in no acute distress, alert and oriented x3   Dermatology: Skin is warm, dry and supple bilateral.  Bilateral hallux nail appears to be  Mildly incurvated with hyperkeratosis formation at the distal aspects of  the medial and lateral nail borders. (-) Erythema. (+) Edema. (-) serosanguous  drainage present. The remaining nails appear unremarkable at this time. There are no open sores, lesions or other signs of infection  present.  Vascular: Dorsalis Pedis artery and Posterior Tibial artery pedal pulses are 1/4 bilateral with immedate capillary fill time. Pedal  hair growth present. No lower extremity edema.   Neruologic: Grossly intact via light touch bilateral.  Musculoskeletal: Tenderness to palpation of the bilateral hallux bilateral nail folds.  There is significant hammertoe deformity with second toe crossover.  Muscular strength within normal limits in all groups bilateral.   Assesement and Plan: Problem List Items Addressed This Visit    None    Visit Diagnoses    Ingrown nail    -  Primary   Hammer toes of both feet       Toe pain, bilateral          -Discussed treatment alternatives and plan of care; Explained permanent/temporary nail avulsion and post procedure course to patient. Patient elects for PNA  bilateral hallux bilateral nail margins. - After a verbal and written consent, injected 3 ml of a 50:50 mixture of 2% plain  lidocaine and 0.5% plain marcaine in a normal hallux block fashion. Next, a  betadine prep was performed. Anesthesia was tested and found to be appropriate.  The offending bilateral hallux medial and lateral nail borders were then incised from the hyponychium to the epinychium. The offending nails borders were removed and cleared from the field. The area was curretted for any remaining nail or spicules. Phenol application performed and the area was then flushed with alcohol and dressed with antibiotic cream and a dry sterile dressing. -Patient was instructed to leave the dressing intact for today and begin soaking  in a weak solution of betadine or Epsom salt and water tomorrow. Patient was instructed to  soak for 15-20 minutes each day and apply neosporin/corticosporin and a gauze or bandaid dressing each day. -Patient was instructed to monitor the toes for signs of infection and return to office if toe becomes red, hot or swollen. -Advised ice, elevation, and tylenol or motrin if needed for pain.  -Patient is to return in 2 weeks for follow up care/nail check or sooner if problems arise.  Advised patient once these procedure sites healed and in the future we can talk about possible tenotomy in office of the deviated elevated toes especially the second toes and any other future concerns that he may have at a follow-up visit.  Landis Martins, DPM

## 2018-05-21 NOTE — Progress Notes (Signed)
   Subjective:    Patient ID: Drew Cisneros, male    DOB: 1940/07/10, 78 y.o.   MRN: [id number]  HPI    Review of Systems  All other systems reviewed and are negative.      Objective:   Physical Exam        Assessment & Plan:

## 2018-05-26 ENCOUNTER — Other Ambulatory Visit: Payer: Self-pay | Admitting: Sports Medicine

## 2018-05-26 ENCOUNTER — Telehealth: Payer: Self-pay | Admitting: *Deleted

## 2018-05-26 MED ORDER — NEOMYCIN-POLYMYXIN-HC 3.5-10000-1 OT SOLN: OTIC | 0 refills | Status: DC

## 2018-05-26 NOTE — Telephone Encounter (Signed)
Patients wife called and stated that they think his toe has gotten infected.  Toe has gotten red all they way around.  Explained to patient that it is not uncommon to be more red swollen and painful 3 to 4 days after procedure.  Recommended they start using the Neosporin with pain reliever and keep soaks up twice daily soaks this will help pull out pain and swelling.  Told her we would call in antibiotic drops for them to apply after soaks to help if possible infection.

## 2018-05-26 NOTE — Telephone Encounter (Signed)
Sent the coriticosporin drops to pharmacy. Have them to also leave the bandage off for a little at end of day. Sometimes if they are keeping the toe wrapped you will see a red ring developing around the toe where the bandages are.

## 2018-05-26 NOTE — Progress Notes (Signed)
Sent Cortisporin to pharmacy to use at toe procedure sites -Dr. Chauncey Cruel

## 2018-05-26 NOTE — Telephone Encounter (Signed)
Called and spoke with patients wife told her the drops were called in to the pharmacy and she should be able to pick them up shortly.  Recommended that the make sure to leave the band aid a little on the loose side to help with that redness and pain.  She stated that they think its infected because when they poor peroxide on it it fizzes like crazy.  I told her to stop using the peroxide immediately because it kills the good and the bad and yes it would fizz like crazy because it is reacting to the bodily fluid the toe is producing to form a scab.  She stated understanding.  They will call if symptoms persist

## 2018-06-04 ENCOUNTER — Ambulatory Visit: Payer: PPO | Admitting: Sports Medicine

## 2018-06-05 ENCOUNTER — Ambulatory Visit (INDEPENDENT_AMBULATORY_CARE_PROVIDER_SITE_OTHER): Payer: PPO | Admitting: Sports Medicine

## 2018-06-05 ENCOUNTER — Other Ambulatory Visit: Payer: Self-pay

## 2018-06-05 ENCOUNTER — Encounter

## 2018-06-05 ENCOUNTER — Ambulatory Visit: Payer: PPO | Admitting: Cardiology

## 2018-06-05 ENCOUNTER — Encounter: Payer: Self-pay | Admitting: Sports Medicine

## 2018-06-05 VITALS — BP 141/80 | HR 78 | Temp 97.4°F | Resp 16

## 2018-06-05 DIAGNOSIS — M79675 Pain in left toe(s): Secondary | ICD-10-CM

## 2018-06-05 DIAGNOSIS — Z9889 Other specified postprocedural states: Secondary | ICD-10-CM

## 2018-06-05 DIAGNOSIS — M79674 Pain in right toe(s): Secondary | ICD-10-CM

## 2018-06-05 DIAGNOSIS — L600 Ingrowing nail: Secondary | ICD-10-CM

## 2018-06-05 NOTE — Patient Instructions (Signed)

## 2018-06-05 NOTE — Progress Notes (Signed)
Subjective: Drew Cisneros is a 78 y.o. male patient returns to office today for follow up evaluation after having Right and Left Hallux medial and lateral permanent nail avulsions performed on (05-21-18). Patient has been soaking using epsom salt and applying topical antibiotic drops covered with bandaid daily. Patient deniesfever/chills/nausea/vomitting/any other related constitutional symptoms at this time.  Patient Active Problem List   Diagnosis Date Noted  . History of DVT (deep vein thrombosis) 08/08/2016  . Left leg pain 08/08/2016  . Right wrist pain 07/23/2016  . Healthcare maintenance 11/14/2015  . RLQ abdominal pain 01/08/2013  . Bradycardia 12/04/2010  . Idiopathic thrombocytopenic purpura ([place])   . Thrombocytopenia ([place])   . Nausea   . Indigestion   . Enlarged thoracic aorta ([place])   . Coronary artery disease   . PAD (peripheral artery disease) ([place])   . AAA (abdominal aortic aneurysm) ([place])   . Iliac aneurysm ([place])   . Hypercholesterolemia   . Hypertension   . Thoracic aortic aneurysm [facility])     Current Outpatient Medications on File Prior to Visit  Medication Sig Dispense Refill  . ammonium lactate (LAC-HYDRIN) 12 % lotion APPLY TO ARMS BID    . cetirizine (ZYRTEC) 10 MG tablet Take 10 mg by mouth as needed.      . clobetasol cream (TEMOVATE) 0.05 % Apply topically 2 (two) times daily. 30 g 3  . diazepam (VALIUM) 5 MG tablet Take 2.5 mg by mouth at bedtime as needed.    . doxazosin (CARDURA) 8 MG tablet Take 1 tablet (8 mg total) by mouth at bedtime. 90 tablet 3  . neomycin-polymyxin-hydrocortisone (CORTISPORIN) OTIC solution Use 1-2 drops twice a day to toes after soaking 10 mL 0  . propranolol (INDERAL) 40 MG tablet Take 40 mg twice a day 60 tablet 6   No current facility-administered medications on file prior to visit.     Allergies  Allergen Reactions  . Aspirin   . Atenolol Other (See Comments)    Ear issues  . Codeine   . Compazine   . Fenofibrate    . Lovaza [Omega-3-Acid Ethyl Esters]   . Penicillins   . Prednisone Other (See Comments)    Increase in BP  . Zetia [Ezetimibe]     Objective:  General: Well developed, nourished, in no acute distress, alert and oriented x3   Dermatology: Skin is warm, dry and supple bilateral. Right and left hallux medial and lateral nail bed appears to be clean, dry, with mild granular tissue and surrounding eschar/scab. (+) Faint blanchable erythema. (-) Edema. (-) serosanguous drainage present. The remaining nails appear unremarkable at this time. There are no other lesions or other signs of infection  present.  Neurovascular status: Intact. No lower extremity swelling; No pain with calf compression bilateral.  Musculoskeletal: Decreased tenderness to palpation of the Bilateral hallux nail fold(s). Muscular strength within normal limits bilateral.   Assesement and Plan: Problem List Items Addressed This Visit    None    Visit Diagnoses    Ingrown nail    -  Primary   Toe pain, bilateral       S/P nail surgery          -Examined patient  -Cleansed right and left hallux medial and lateral nail fold and gently scrubbed with peroxide and q-tip/curetted away eschar at site and applied antibiotic cream covered with bandaid.  -Discussed plan of care with patient. -Patient to now begin soaking in a weak solution of Epsom salt  and warm water. Patient was instructed to soak for 15-20 minutes each day until the toe appears normal and there is no drainage, redness, tenderness, or swelling at the procedure site, and apply neosporin and a gauze or bandaid dressing each day as needed. May leave open to air at night. -Educated patient on long term care after nail surgery. -Patient was instructed to monitor the toe for reoccurrence and signs of infection; Patient advised to return to office or go to ER if toe becomes red, hot or swollen. -Patient is to return as needed or sooner if problems arise.  Landis Martins, DPM

## 2018-07-29 DIAGNOSIS — L578 Other skin changes due to chronic exposure to nonionizing radiation: Secondary | ICD-10-CM | POA: Diagnosis not present

## 2018-07-29 DIAGNOSIS — L820 Inflamed seborrheic keratosis: Secondary | ICD-10-CM | POA: Diagnosis not present

## 2018-07-29 DIAGNOSIS — A6001 Herpesviral infection of penis: Secondary | ICD-10-CM | POA: Diagnosis not present

## 2018-07-29 DIAGNOSIS — L821 Other seborrheic keratosis: Secondary | ICD-10-CM | POA: Diagnosis not present

## 2018-08-06 DIAGNOSIS — H9313 Tinnitus, bilateral: Secondary | ICD-10-CM | POA: Diagnosis not present

## 2018-08-06 DIAGNOSIS — I10 Essential (primary) hypertension: Secondary | ICD-10-CM | POA: Diagnosis not present

## 2018-09-02 DIAGNOSIS — M79671 Pain in right foot: Secondary | ICD-10-CM | POA: Diagnosis not present

## 2018-11-06 DIAGNOSIS — R000 Tachycardia, unspecified: Secondary | ICD-10-CM | POA: Diagnosis not present

## 2018-11-06 DIAGNOSIS — I10 Essential (primary) hypertension: Secondary | ICD-10-CM | POA: Diagnosis not present

## 2018-11-18 DIAGNOSIS — H527 Unspecified disorder of refraction: Secondary | ICD-10-CM | POA: Diagnosis not present

## 2018-11-18 DIAGNOSIS — H25813 Combined forms of age-related cataract, bilateral: Secondary | ICD-10-CM | POA: Diagnosis not present

## 2018-11-18 DIAGNOSIS — H40003 Preglaucoma, unspecified, bilateral: Secondary | ICD-10-CM | POA: Diagnosis not present

## 2018-11-19 DIAGNOSIS — Z125 Encounter for screening for malignant neoplasm of prostate: Secondary | ICD-10-CM | POA: Diagnosis not present

## 2018-11-19 DIAGNOSIS — I10 Essential (primary) hypertension: Secondary | ICD-10-CM | POA: Diagnosis not present

## 2018-11-19 DIAGNOSIS — Z1329 Encounter for screening for other suspected endocrine disorder: Secondary | ICD-10-CM | POA: Diagnosis not present

## 2018-11-19 DIAGNOSIS — R7301 Impaired fasting glucose: Secondary | ICD-10-CM | POA: Diagnosis not present

## 2018-11-19 DIAGNOSIS — E785 Hyperlipidemia, unspecified: Secondary | ICD-10-CM | POA: Diagnosis not present

## 2018-11-20 ENCOUNTER — Ambulatory Visit (INDEPENDENT_AMBULATORY_CARE_PROVIDER_SITE_OTHER): Payer: PPO | Admitting: Sports Medicine

## 2018-11-20 ENCOUNTER — Encounter: Payer: Self-pay | Admitting: Sports Medicine

## 2018-11-20 ENCOUNTER — Other Ambulatory Visit: Payer: Self-pay

## 2018-11-20 DIAGNOSIS — M79675 Pain in left toe(s): Secondary | ICD-10-CM | POA: Diagnosis not present

## 2018-11-20 DIAGNOSIS — B351 Tinea unguium: Secondary | ICD-10-CM

## 2018-11-20 DIAGNOSIS — M79674 Pain in right toe(s): Secondary | ICD-10-CM | POA: Diagnosis not present

## 2018-11-20 NOTE — Progress Notes (Signed)
Subjective: Drew Cisneros is a 78 y.o. male patient seen today in office with complaint of mildly painful thickened and elongated toenails; unable to trim.  Patient reports that he still gets some soreness at his first toes and not sure if anything is regrowing back out into the skin from previous nail procedure. Patient has no other pedal complaints at this time.   Patient Active Problem List   Diagnosis Date Noted  . History of DVT (deep vein thrombosis) 08/08/2016  . Left leg pain 08/08/2016  . Right wrist pain 07/23/2016  . Healthcare maintenance 11/14/2015  . RLQ abdominal pain 01/08/2013  . Bradycardia 12/04/2010  . Idiopathic thrombocytopenic purpura ([place])   . Thrombocytopenia ([place])   . Nausea   . Indigestion   . Enlarged thoracic aorta ([place])   . Coronary artery disease   . PAD (peripheral artery disease) ([place])   . AAA (abdominal aortic aneurysm) ([place])   . Iliac aneurysm ([place])   . Hypercholesterolemia   . Hypertension   . Thoracic aortic aneurysm [facility])     Current Outpatient Medications on File Prior to Visit  Medication Sig Dispense Refill  . ammonium lactate (LAC-HYDRIN) 12 % lotion APPLY TO ARMS BID    . cetirizine (ZYRTEC) 10 MG tablet Take 10 mg by mouth as needed.      . clobetasol cream (TEMOVATE) 0.05 % Apply topically 2 (two) times daily. 30 g 3  . diazepam (VALIUM) 5 MG tablet Take 2.5 mg by mouth at bedtime as needed.    . doxazosin (CARDURA) 8 MG tablet Take 1 tablet (8 mg total) by mouth at bedtime. 90 tablet 3  . mupirocin ointment (BACTROBAN) 2 % APPLY OINTMENT TOPICALLY TO AFFECTED AREA THREE TIMES DAILY FOR 14 DAYS    . neomycin-polymyxin-hydrocortisone (CORTISPORIN) OTIC solution Use 1-2 drops twice a day to toes after soaking 10 mL 0  . predniSONE (DELTASONE) 20 MG tablet Take two tablets daily for five days    . propranolol (INDERAL) 40 MG tablet Take 40 mg twice a day 60 tablet 6   No current facility-administered medications on file prior to visit.      Allergies  Allergen Reactions  . Aspirin   . Atenolol Other (See Comments)    Ear issues  . Codeine   . Compazine   . Fenofibrate   . Lovaza [Omega-3-Acid Ethyl Esters]   . Penicillins   . Prednisone Other (See Comments)    Increase in BP  . Zetia [Ezetimibe]     Objective: Physical Exam  General: Well developed, nourished, no acute distress, awake, alert and oriented x 3  Vascular: Dorsalis pedis artery 1/4 bilateral, Posterior tibial artery 1/4 bilateral, skin temperature warm to warm proximal to distal bilateral lower extremities, there is varicosities, decreased pedal hair present bilateral.  Neurological: Gross sensation present via light touch bilateral.   Dermatological: Skin is warm, dry, and supple bilateral, nails are minimally thickened bilateral there is no recurrence of ingrown noted at the first toes bilateral there is small areas of rough dry skin at the nail beds with no signs of infection, no open lesions present bilateral, no callus/corns/hyperkeratotic tissue present bilateral.  Musculoskeletal: Crossover hammertoe boney deformities noted bilateral. Muscular strength within normal limits without painon range of motion. No pain with calf compression bilateral.  Assessment and Plan:  Problem List Items Addressed This Visit    None    Visit Diagnoses    Pain due to onychomycosis of toenails of both feet    -  Primary   Relevant Medications   mupirocin ointment (BACTROBAN) 2 %      -Examined patient.  -Discussed treatment options for painful mycotic nails. -Mechanically debrided and reduced mycotic nails with sterile nail nipper and dremel nail file without incident. -Continue with toe separator -Patient to return to office as needed for his nails to be trim or sooner if symptoms worsen.  Landis Martins, DPM

## 2018-11-25 DIAGNOSIS — Z0000 Encounter for general adult medical examination without abnormal findings: Secondary | ICD-10-CM | POA: Diagnosis not present

## 2018-12-02 DIAGNOSIS — G301 Alzheimer's disease with late onset: Secondary | ICD-10-CM | POA: Diagnosis not present

## 2018-12-02 DIAGNOSIS — F0281 Dementia in other diseases classified elsewhere with behavioral disturbance: Secondary | ICD-10-CM | POA: Diagnosis not present

## 2018-12-23 DIAGNOSIS — M1A0710 Idiopathic chronic gout, right ankle and foot, without tophus (tophi): Secondary | ICD-10-CM | POA: Diagnosis not present

## 2019-05-25 DIAGNOSIS — M1A0710 Idiopathic chronic gout, right ankle and foot, without tophus (tophi): Secondary | ICD-10-CM | POA: Diagnosis not present

## 2019-06-19 DIAGNOSIS — M272 Inflammatory conditions of jaws: Secondary | ICD-10-CM | POA: Diagnosis not present

## 2019-06-22 DIAGNOSIS — M272 Inflammatory conditions of jaws: Secondary | ICD-10-CM | POA: Diagnosis not present

## 2019-06-30 DIAGNOSIS — L821 Other seborrheic keratosis: Secondary | ICD-10-CM | POA: Diagnosis not present

## 2019-06-30 DIAGNOSIS — L2084 Intrinsic (allergic) eczema: Secondary | ICD-10-CM | POA: Diagnosis not present

## 2019-06-30 DIAGNOSIS — D485 Neoplasm of uncertain behavior of skin: Secondary | ICD-10-CM | POA: Diagnosis not present

## 2019-06-30 DIAGNOSIS — L905 Scar conditions and fibrosis of skin: Secondary | ICD-10-CM | POA: Diagnosis not present

## 2019-06-30 DIAGNOSIS — L814 Other melanin hyperpigmentation: Secondary | ICD-10-CM | POA: Diagnosis not present

## 2019-06-30 DIAGNOSIS — Z85828 Personal history of other malignant neoplasm of skin: Secondary | ICD-10-CM | POA: Diagnosis not present

## 2019-06-30 DIAGNOSIS — L218 Other seborrheic dermatitis: Secondary | ICD-10-CM | POA: Diagnosis not present

## 2019-06-30 DIAGNOSIS — L570 Actinic keratosis: Secondary | ICD-10-CM | POA: Diagnosis not present

## 2019-07-17 DIAGNOSIS — M272 Inflammatory conditions of jaws: Secondary | ICD-10-CM | POA: Diagnosis not present

## 2019-08-17 DIAGNOSIS — L821 Other seborrheic keratosis: Secondary | ICD-10-CM | POA: Diagnosis not present

## 2019-08-17 DIAGNOSIS — D229 Melanocytic nevi, unspecified: Secondary | ICD-10-CM | POA: Diagnosis not present

## 2019-08-17 DIAGNOSIS — L309 Dermatitis, unspecified: Secondary | ICD-10-CM | POA: Diagnosis not present

## 2019-08-17 DIAGNOSIS — D1801 Hemangioma of skin and subcutaneous tissue: Secondary | ICD-10-CM | POA: Diagnosis not present

## 2019-08-17 DIAGNOSIS — L570 Actinic keratosis: Secondary | ICD-10-CM | POA: Diagnosis not present

## 2019-08-17 DIAGNOSIS — L819 Disorder of pigmentation, unspecified: Secondary | ICD-10-CM | POA: Diagnosis not present

## 2019-08-24 DIAGNOSIS — H6123 Impacted cerumen, bilateral: Secondary | ICD-10-CM | POA: Diagnosis not present

## 2019-08-24 DIAGNOSIS — H9313 Tinnitus, bilateral: Secondary | ICD-10-CM | POA: Diagnosis not present

## 2019-09-02 DIAGNOSIS — B001 Herpesviral vesicular dermatitis: Secondary | ICD-10-CM | POA: Diagnosis not present

## 2019-09-02 DIAGNOSIS — K120 Recurrent oral aphthae: Secondary | ICD-10-CM | POA: Diagnosis not present

## 2019-11-24 DIAGNOSIS — E7800 Pure hypercholesterolemia, unspecified: Secondary | ICD-10-CM | POA: Diagnosis not present

## 2019-11-24 DIAGNOSIS — I714 Abdominal aortic aneurysm, without rupture: Secondary | ICD-10-CM | POA: Diagnosis not present

## 2019-11-24 DIAGNOSIS — G4452 New daily persistent headache (NDPH): Secondary | ICD-10-CM | POA: Diagnosis not present

## 2019-11-24 DIAGNOSIS — I2510 Atherosclerotic heart disease of native coronary artery without angina pectoris: Secondary | ICD-10-CM | POA: Diagnosis not present

## 2019-11-24 DIAGNOSIS — D696 Thrombocytopenia, unspecified: Secondary | ICD-10-CM | POA: Diagnosis not present

## 2019-11-24 DIAGNOSIS — I10 Essential (primary) hypertension: Secondary | ICD-10-CM | POA: Diagnosis not present

## 2019-11-24 DIAGNOSIS — I739 Peripheral vascular disease, unspecified: Secondary | ICD-10-CM | POA: Diagnosis not present

## 2019-11-24 DIAGNOSIS — R7303 Prediabetes: Secondary | ICD-10-CM | POA: Diagnosis not present

## 2019-11-25 DIAGNOSIS — G4452 New daily persistent headache (NDPH): Secondary | ICD-10-CM | POA: Diagnosis not present

## 2019-11-25 DIAGNOSIS — R519 Headache, unspecified: Secondary | ICD-10-CM | POA: Diagnosis not present

## 2019-12-01 DIAGNOSIS — M5030 Other cervical disc degeneration, unspecified cervical region: Secondary | ICD-10-CM | POA: Diagnosis not present

## 2019-12-01 DIAGNOSIS — Z0000 Encounter for general adult medical examination without abnormal findings: Secondary | ICD-10-CM | POA: Diagnosis not present

## 2019-12-01 DIAGNOSIS — I2510 Atherosclerotic heart disease of native coronary artery without angina pectoris: Secondary | ICD-10-CM | POA: Diagnosis not present

## 2019-12-01 DIAGNOSIS — E781 Pure hyperglyceridemia: Secondary | ICD-10-CM | POA: Diagnosis not present

## 2019-12-01 DIAGNOSIS — I712 Thoracic aortic aneurysm, without rupture: Secondary | ICD-10-CM | POA: Diagnosis not present

## 2019-12-01 DIAGNOSIS — I10 Essential (primary) hypertension: Secondary | ICD-10-CM | POA: Diagnosis not present

## 2019-12-01 DIAGNOSIS — L309 Dermatitis, unspecified: Secondary | ICD-10-CM | POA: Diagnosis not present

## 2019-12-01 DIAGNOSIS — R7303 Prediabetes: Secondary | ICD-10-CM | POA: Diagnosis not present

## 2019-12-01 DIAGNOSIS — D696 Thrombocytopenia, unspecified: Secondary | ICD-10-CM | POA: Diagnosis not present

## 2019-12-08 DIAGNOSIS — M47812 Spondylosis without myelopathy or radiculopathy, cervical region: Secondary | ICD-10-CM | POA: Diagnosis not present

## 2019-12-08 DIAGNOSIS — M4312 Spondylolisthesis, cervical region: Secondary | ICD-10-CM | POA: Diagnosis not present

## 2019-12-08 DIAGNOSIS — M5030 Other cervical disc degeneration, unspecified cervical region: Secondary | ICD-10-CM | POA: Diagnosis not present

## 2019-12-08 DIAGNOSIS — M4802 Spinal stenosis, cervical region: Secondary | ICD-10-CM | POA: Diagnosis not present

## 2019-12-08 DIAGNOSIS — M2578 Osteophyte, vertebrae: Secondary | ICD-10-CM | POA: Diagnosis not present

## 2020-01-14 DIAGNOSIS — M542 Cervicalgia: Secondary | ICD-10-CM | POA: Diagnosis not present

## 2020-02-12 DIAGNOSIS — M542 Cervicalgia: Secondary | ICD-10-CM | POA: Diagnosis not present

## 2020-02-12 DIAGNOSIS — M1991 Primary osteoarthritis, unspecified site: Secondary | ICD-10-CM | POA: Diagnosis not present

## 2020-02-12 DIAGNOSIS — R519 Headache, unspecified: Secondary | ICD-10-CM | POA: Diagnosis not present

## 2020-02-19 DIAGNOSIS — L309 Dermatitis, unspecified: Secondary | ICD-10-CM | POA: Diagnosis not present

## 2020-05-18 DIAGNOSIS — M10072 Idiopathic gout, left ankle and foot: Secondary | ICD-10-CM | POA: Diagnosis not present

## 2020-06-09 DIAGNOSIS — N5312 Painful ejaculation: Secondary | ICD-10-CM | POA: Diagnosis not present

## 2020-07-25 DIAGNOSIS — L309 Dermatitis, unspecified: Secondary | ICD-10-CM | POA: Diagnosis not present

## 2020-07-25 DIAGNOSIS — D2239 Melanocytic nevi of other parts of face: Secondary | ICD-10-CM | POA: Diagnosis not present

## 2020-07-25 DIAGNOSIS — L209 Atopic dermatitis, unspecified: Secondary | ICD-10-CM | POA: Diagnosis not present

## 2020-07-25 DIAGNOSIS — L820 Inflamed seborrheic keratosis: Secondary | ICD-10-CM | POA: Diagnosis not present

## 2020-07-25 DIAGNOSIS — L570 Actinic keratosis: Secondary | ICD-10-CM | POA: Diagnosis not present

## 2020-09-15 DIAGNOSIS — M1A0720 Idiopathic chronic gout, left ankle and foot, without tophus (tophi): Secondary | ICD-10-CM | POA: Diagnosis not present

## 2020-09-15 DIAGNOSIS — I712 Thoracic aortic aneurysm, without rupture: Secondary | ICD-10-CM | POA: Diagnosis not present

## 2020-09-20 ENCOUNTER — Other Ambulatory Visit: Payer: Self-pay | Admitting: Physician Assistant

## 2020-09-20 DIAGNOSIS — I712 Thoracic aortic aneurysm, without rupture, unspecified: Secondary | ICD-10-CM

## 2020-10-05 ENCOUNTER — Ambulatory Visit
Admission: RE | Admit: 2020-10-05 | Discharge: 2020-10-05 | Disposition: A | Discharge status: Home / Self Care | Payer: PPO | Source: Ambulatory Visit | Attending: Physician Assistant | Admitting: Physician Assistant

## 2020-10-05 ENCOUNTER — Inpatient Hospital Stay: Admission: RE | Admit: 2020-10-05 | Payer: PPO | Source: Ambulatory Visit

## 2020-10-05 DIAGNOSIS — I712 Thoracic aortic aneurysm, without rupture, unspecified: Secondary | ICD-10-CM

## 2020-10-05 DIAGNOSIS — J439 Emphysema, unspecified: Secondary | ICD-10-CM | POA: Diagnosis not present

## 2020-10-05 DIAGNOSIS — I700 Atherosclerosis of aorta: Secondary | ICD-10-CM | POA: Diagnosis not present

## 2020-10-26 ENCOUNTER — Encounter: Payer: Self-pay | Admitting: Surgery

## 2020-10-26 ENCOUNTER — Other Ambulatory Visit: Payer: Self-pay

## 2020-10-26 ENCOUNTER — Ambulatory Visit: Payer: PPO | Admitting: Surgery

## 2020-10-26 VITALS — BP 96/61 | HR 79 | Resp 20 | Wt 154.0 lb

## 2020-10-26 DIAGNOSIS — I712 Thoracic aortic aneurysm, without rupture: Secondary | ICD-10-CM | POA: Diagnosis not present

## 2020-10-26 DIAGNOSIS — I7121 Aneurysm of the ascending aorta, without rupture: Secondary | ICD-10-CM

## 2020-10-26 NOTE — Progress Notes (Signed)
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  He continues to feel well and denies any chest pain or shortness of breath.  Current Outpatient Medications  Medication Sig Dispense Refill   ammonium lactate (LAC-HYDRIN) 12 % lotion APPLY TO ARMS BID     cetirizine (ZYRTEC) 10 MG tablet Take 10 mg by mouth as needed.       clobetasol cream (TEMOVATE) 0.05 % Apply topically 2 (two) times daily. 30 g 3   diazepam (VALIUM) 5 MG tablet Take 2.5 mg by mouth at bedtime as needed.     doxazosin (CARDURA) 8 MG tablet Take 1 tablet (8 mg total) by mouth at bedtime. 90 tablet 3   mupirocin ointment (BACTROBAN) 2 % APPLY OINTMENT TOPICALLY TO AFFECTED AREA THREE TIMES DAILY FOR 14 DAYS     neomycin-polymyxin-hydrocortisone (CORTISPORIN) OTIC solution Use 1-2 drops twice a day to toes after soaking 10 mL 0   predniSONE (DELTASONE) 20 MG tablet Take two tablets daily for five days     propranolol (INDERAL) 40 MG tablet 40 mg. Take 40 mg 3-4 times day 60 tablet 6   No current facility-administered medications for this visit.     Physical Exam: BP 96/61 (BP Location: Right Arm, Patient Position: Sitting)   Pulse 79   Resp 20   Wt 154 lb (69.9 kg)   SpO2 94% Comment: RA  BMI 23.42 kg/m  He looks well. Cardiac exam shows a regular rate and rhythm with normal heart sounds.  There is no murmur. Lungs are clear. There is no peripheral edema.  Diagnostic Tests:  Narrative & Impression  CLINICAL DATA:  Follow-up thoracic aortic aneurysm   EXAM: CT CHEST WITHOUT CONTRAST   TECHNIQUE: Multidetector CT imaging of the chest was performed following the standard protocol without IV contrast.   COMPARISON:  05/29/2013   FINDINGS: Cardiovascular: Mild bulge of the aortic arch. Overall diameter of the aortic arch at this level measures 4 cm compared to 3.4  cm previously. Descending thoracic aortic aneurysm measuring up to 4.3 cm compared to 3.5 cm previously. Distal descending thoracic aorta measures 4.7 cm maximally compared to 3.6 cm previously. Ascending thoracic aorta 4.2 cm maximally compared to 4 cm previously. Diffuse aortic atherosclerosis. Diffuse coronary artery calcifications. Heart is normal size.   Mediastinum/Nodes: No mediastinal, hilar, or axillary adenopathy. Trachea and esophagus are unremarkable. Thyroid unremarkable.   Lungs/Pleura: Moderate paraseptal and centrilobular emphysema. No confluent opacities or effusions.   Upper Abdomen: Imaging into the upper abdomen demonstrates no acute findings.   Musculoskeletal: Chest wall soft tissues are unremarkable. No acute bony abnormality.   IMPRESSION: Diffuse aneurysmal dilatation of the thoracic aorta, maximally 4.7 cm in the distal descending thoracic aorta.   No acute cardiopulmonary disease.   Aortic Atherosclerosis (ICD10-I70.0) and Emphysema (ICD10-J43.9).     Electronically Signed   By: Rolm Baptise M.D.   On: 10/06/2020 10:02    Impression: This 80 year old gentleman has diffuse enlargement of his thoracic aorta.  His dimensions have increased since 2015 when he had his last scan done.  The ascending aorta has diameter of 4.2 cm compared to previously when it was 4.0 cm. The aortic arch is increased from 3.4 cm to 4 cm.  The descending aorta has increased in size from 3.5 cm to 4.3 cm.  The distal descending aorta  has maximum diameter of 4.7 cm compared to 3.6 cm previously.  These dimensions were all below the surgical threshold of 5.5-6.0 centimeters.  I reviewed the CT images with the patient and answered all of his questions.  I stressed the importance of continued good blood pressure control in preventing further enlargement and acute aortic dissection.  I recommended that he have a follow-up scan in 1 year.  Plan:  He will return to see me in 1 year  with a CT of the chest without contrast.  I spent 20 minutes performing this established patient evaluation and > 50% of this time was spent face to face counseling and coordinating the care of this patient's aortic aneurysm.    Gaye Pollack, MD [facility] [phone]

## 2020-12-01 DIAGNOSIS — D696 Thrombocytopenia, unspecified: Secondary | ICD-10-CM | POA: Diagnosis not present

## 2020-12-01 DIAGNOSIS — E781 Pure hyperglyceridemia: Secondary | ICD-10-CM | POA: Diagnosis not present

## 2020-12-01 DIAGNOSIS — R7303 Prediabetes: Secondary | ICD-10-CM | POA: Diagnosis not present

## 2020-12-01 DIAGNOSIS — M79671 Pain in right foot: Secondary | ICD-10-CM | POA: Diagnosis not present

## 2020-12-01 DIAGNOSIS — N289 Disorder of kidney and ureter, unspecified: Secondary | ICD-10-CM | POA: Diagnosis not present

## 2020-12-01 DIAGNOSIS — Z0000 Encounter for general adult medical examination without abnormal findings: Secondary | ICD-10-CM | POA: Diagnosis not present

## 2020-12-01 DIAGNOSIS — M10371 Gout due to renal impairment, right ankle and foot: Secondary | ICD-10-CM | POA: Diagnosis not present

## 2020-12-01 DIAGNOSIS — I10 Essential (primary) hypertension: Secondary | ICD-10-CM | POA: Diagnosis not present

## 2020-12-07 DIAGNOSIS — M10071 Idiopathic gout, right ankle and foot: Secondary | ICD-10-CM | POA: Diagnosis not present

## 2021-01-18 DIAGNOSIS — M79672 Pain in left foot: Secondary | ICD-10-CM | POA: Diagnosis not present

## 2021-02-16 DIAGNOSIS — L570 Actinic keratosis: Secondary | ICD-10-CM | POA: Diagnosis not present

## 2021-02-16 DIAGNOSIS — L821 Other seborrheic keratosis: Secondary | ICD-10-CM | POA: Diagnosis not present

## 2021-02-16 DIAGNOSIS — L820 Inflamed seborrheic keratosis: Secondary | ICD-10-CM | POA: Diagnosis not present

## 2021-09-01 ENCOUNTER — Other Ambulatory Visit: Payer: Self-pay | Admitting: Surgery

## 2021-09-01 DIAGNOSIS — I7121 Aneurysm of the ascending aorta, without rupture: Secondary | ICD-10-CM

## 2021-11-08 ENCOUNTER — Other Ambulatory Visit: Payer: PPO

## 2021-11-08 ENCOUNTER — Ambulatory Visit: Payer: PPO | Admitting: Surgery

## 2021-11-23 NOTE — Progress Notes (Signed)
Patient here today for BP screening. Patient had no other concerns to address today

## 2022-04-24 ENCOUNTER — Telehealth: Payer: Self-pay | Admitting: Cardiology

## 2022-04-24 NOTE — Telephone Encounter (Signed)
Pt c/o of Chest Pain: STAT if CP now or developed within 24 hours  1. Are you having CP right now? No.   2. Are you experiencing any other symptoms (ex. SOB, nausea, vomiting, sweating)? No   3. How long have you been experiencing CP? Since yesterday   4. Is your CP continuous or coming and going? Continuous   5. Have you taken Nitroglycerin? No  ?

## 2022-04-24 NOTE — Telephone Encounter (Signed)
Spoke with pt, he reports pain in his left side, under his breast this morning. It is a burning, jabbing pain. He denies SOB or radiation. He reports it maybe due to medication changes. The patient has not been seen since 2019 and reports his medical doctor is making changes in his medications. He reports the pain is gone now and he does not want to make an appointment. ER precautions discussed with the patient.

## 2022-07-25 ENCOUNTER — Ambulatory Visit: Payer: PPO | Admitting: Surgery

## 2022-08-08 ENCOUNTER — Ambulatory Visit: Payer: PPO | Admitting: Surgery

## 2022-08-08 ENCOUNTER — Other Ambulatory Visit: Payer: PPO

## 2022-09-02 NOTE — Progress Notes (Deleted)
Cardiology Office Note:    Date:  09/02/2022   ID:  Drew Cisneros, DOB 27-Dec-1940, MRN [id number]  PCP:  Drew Reichmann, DO   [facility] Providers Cardiologist:  None { Click to update primary MD,subspecialty MD or APP then REFRESH:1}    Referring MD: Drew Reichmann, DO   No chief complaint on file. ***  History of Present Illness:    Drew Cisneros is a 82 y.o. male seen at the request of Drew Cisneros to reestablish cardiac care. Last seen in 2019. He has a history of coronary disease. In 2010 we attempted to perform a PCI on the RCA. This was unsuccessful. He has been managed medically since then Last Myoview in 2016 showed a small inferior scar without ischemia and EF 67%. He also has enlargement of the thoracic aorta and is followed every other year by Drew Cisneros. He last CT in August 2022 showed maximal dimension of 4.7 cm in the distal descending thoracic aorta.  He has hypertriglyceridemia but is intolerant of multiple lipid lowering drugs including fenofibrate, Trilipix, crestor, Zocor, Mevacor, and Lovaza. Also intolerant of metoprolol.   Past Medical History:  Diagnosis Date   AAA (abdominal aortic aneurysm) (HCC)    S/P AAA repair. Infra renal AAA measures 4.0 cm in August of 2012   Coronary artery disease    a. Unsuccessful PCI to RCA in 2010 --> medically managed. b. 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 Edilia Cisneros   Renal artery stenosis [facility])    Left per cardiac cath in 2010   Thoracic aortic  aneurysm [facility])    followed by Drew Cisneros   Thrombocytopenia [facility])     Past Surgical History:  Procedure Laterality Date   ABDOMINAL AORTIC ANEURYSM REPAIR     CARDIAC CATHETERIZATION  2010   EF 60% with single vessel CAD. He underwent attempt at PCI  with dissection and had successful stenting of the prox RCA but unsuccessful PCI to the mid RCA. Mild to moderate disease in the left system noted.    CHOLECYSTECTOMY     ILIAC ARTERY ANEURYSM REPAIR     INGUINAL HERNIA REPAIR     LUMBAR SPINE SURGERY     ROTATOR CUFF REPAIR W/ DISTAL CLAVICLE EXCISION     UMBILICAL HERNIA REPAIR      Current Medications: No outpatient medications have been marked as taking for the 09/04/22 encounter (Appointment) with [place], Shaketa Serafin M, MD.     Allergies:   Aspirin, Atenolol, Codeine, Compazine, Fenofibrate, Lovaza [omega-3-acid ethyl esters], Penicillins, Prednisone, and Zetia [ezetimibe]   Social History   Socioeconomic History   Marital status: Married    Spouse name: Not on file   Number of children: 2   Years of education: Not on file   Highest education level: Not on file  Occupational History   Occupation: Air traffic controller: RETIRED    Comment: retired  Tobacco Use  Smoking status: Former    Types: Cigarettes    Quit date: 07/21/1990    Years since quitting: 32.1   Smokeless tobacco: Never  Substance and Sexual Activity   Alcohol use: No   Drug use: No   Sexual activity: Not on file  Other Topics Concern   Not on file  Social History Narrative   Not on file   Social Determinants of Health   Financial Resource Strain: Not on file  Food Insecurity: Not on file  Transportation Needs: Not on file  Physical Activity: Not on file  Stress: Not on file  Social Connections: Not on file     Family History: The patient's ***family history includes Coronary artery disease in his brother; Diabetes in his brother and mother.  ROS:   Please see the history of present illness.     *** All other systems reviewed and are negative.  EKGs/Labs/Other Studies Reviewed:    The following studies were reviewed today: CT CHEST WITHOUT CONTRAST   TECHNIQUE: Multidetector CT imaging of the chest was performed following the standard protocol without IV contrast.   COMPARISON:  05/29/2013   FINDINGS: Cardiovascular: Mild bulge of the aortic arch. Overall diameter of the aortic arch at this level measures 4 cm compared to 3.4 cm previously. Descending thoracic aortic aneurysm measuring up to 4.3 cm compared to 3.5 cm previously. Distal descending thoracic aorta measures 4.7 cm maximally compared to 3.6 cm previously. Ascending thoracic aorta 4.2 cm maximally compared to 4 cm previously. Diffuse aortic atherosclerosis. Diffuse coronary artery calcifications. Heart is normal size.   Mediastinum/Nodes: No mediastinal, hilar, or axillary adenopathy. Trachea and esophagus are unremarkable. Thyroid unremarkable.   Lungs/Pleura: Moderate paraseptal and centrilobular emphysema. No confluent opacities or effusions.   Upper Abdomen: Imaging into the upper abdomen demonstrates no acute findings.   Musculoskeletal: Chest wall soft tissues are unremarkable. No acute bony abnormality.   IMPRESSION: Diffuse aneurysmal dilatation of the thoracic aorta, maximally 4.7 cm in the distal descending thoracic aorta.   No acute cardiopulmonary disease.   Aortic Atherosclerosis (ICD10-I70.0) and Emphysema (ICD10-J43.9).     Electronically Signed   By: Charlett Nose Cisneros.D.   On: 10/06/2020 10:02        Recent Labs: No results found for requested labs within last 365 days.  Recent Lipid Panel    Component Value Date/Time   CHOL 115 07/25/2010 0816   TRIG 481.0 (H) 07/25/2010 0816   HDL 23.70 (L) 07/25/2010 0816   CHOLHDL 5 07/25/2010 0816   VLDL 96.2 (H) 07/25/2010 0816   LDLDIRECT 38.3 07/25/2010 0816   Labs dated 11/28/21: cholesterol 136, triglycerides 342, HDL 25, LDL  57. A1c 5.5%. CBC and CMET normal.  Risk Assessment/Calculations:   {Does this patient have ATRIAL FIBRILLATION?:[phone]}  No BP recorded.  {Refresh Note OR Click here to enter BP  :1}***         Physical Exam:    VS:  There were no vitals taken for this visit.    Wt Readings from Last 3 Encounters:  11/23/21 147 lb (66.7 kg)  10/26/20 154 lb (69.9 kg)  05/21/18 160 lb (72.6 kg)     GEN: *** Well nourished, well developed in no acute distress HEENT: Normal NECK: No JVD; No carotid bruits LYMPHATICS: No lymphadenopathy CARDIAC: ***RRR, no murmurs, rubs, gallops RESPIRATORY:  Clear to auscultation without rales, wheezing or rhonchi  ABDOMEN: Soft, non-tender, non-distended MUSCULOSKELETAL:  No edema; No deformity  SKIN: Warm and dry NEUROLOGIC:  Alert and oriented x 3 PSYCHIATRIC:  Normal affect   ASSESSMENT:    No diagnosis found. PLAN:    In order of problems listed above:  1. Coronary disease with history of failed PCI of the RCA in 2010. Otherwise nonobstructive disease. He has no anginal symptoms.  We will continue medical management with beta blocker. Myoview study in Jan. 2016 showed a small inferolateral scar without ischemia. 2. Dyslipidemia with predominant hypertriglyceridemia and low HDL.  Intolerant of multiple drugs and fish oil. Stressed importance of dietary modifications with weight loss. Avoid simple carbs and sweets.  3. Thoracic aortic aneurysm. 3.9 cm. Asymptomatic. 4. Hypertension-well controlled. 5. History of ITP. 6. PVCs. Asymptomatic.        {Are you ordering a CV Procedure (e.g. stress test, cath, DCCV, TEE, etc)?   Press F2        :[id number]}    Medication Adjustments/Labs and Tests Ordered: Current medicines are reviewed at length with the patient today.  Concerns regarding medicines are outlined above.  No orders of the defined types were placed in this encounter.  No orders of the defined types were placed in this  encounter.   There are no Patient Instructions on file for this visit.   Signed, Shai Rasmussen [place], MD  09/02/2022 6:38 AM    [facility]

## 2022-09-04 ENCOUNTER — Ambulatory Visit: Payer: HMO | Admitting: Cardiology

## 2022-10-04 ENCOUNTER — Other Ambulatory Visit: Payer: Self-pay | Admitting: Orthopedic Surgery

## 2022-10-04 DIAGNOSIS — M75101 Unspecified rotator cuff tear or rupture of right shoulder, not specified as traumatic: Secondary | ICD-10-CM

## 2022-10-07 ENCOUNTER — Ambulatory Visit
Admission: RE | Admit: 2022-10-07 | Discharge: 2022-10-07 | Disposition: A | Discharge status: Home / Self Care | Payer: HMO | Source: Ambulatory Visit | Attending: Orthopedic Surgery | Admitting: Orthopedic Surgery

## 2022-10-07 DIAGNOSIS — M75101 Unspecified rotator cuff tear or rupture of right shoulder, not specified as traumatic: Secondary | ICD-10-CM

## 2022-10-23 ENCOUNTER — Telehealth: Payer: Self-pay | Admitting: *Deleted

## 2022-10-23 NOTE — Telephone Encounter (Signed)
Pt has new pt appt with Dr. [place] 12/26/22. Pt last seen 2019. I will update all parties involved.

## 2022-10-23 NOTE — Telephone Encounter (Signed)
   Pre-operative Risk Assessment    Patient Name: Drew Cisneros  DOB: 12-Oct-1940 MRN: [id number]    DATE OF LAST VISIT: 04/02/17 WITH DR. [place]; PT WILL NEED A NEW PT APPT  DATE OF NEXT VISIT: 12/26/22 NEW PT APPT WITH DR. [place]  Request for Surgical Clearance    Procedure:   RIGHT REVERSE TOTAL SHOULDER REPLACEMENT  Date of Surgery:  Clearance TBD                                 Surgeon:  DR. Teryl Lucy Surgeon's Group or Practice Name:  [facility] Phone number:  [phone] ATTN: Silvestre Mesi [address]T [address]132 Fax number:  [phone]   Type of Clearance Requested:   - Medical ; NO MEDICATIONS LISTED AS NEEDING TO BE HELD   Type of Anesthesia:  General  WITH INTERSCALENE BLOCK   Additional requests/questions:    Elpidio Anis   10/23/2022, 10:41 AM

## 2022-10-23 NOTE — Telephone Encounter (Signed)
    Primary Cardiologist:None  Chart reviewed as part of pre-operative protocol coverage. Because of Drew Cisneros's past medical history and time since last visit, he/she will require a follow-up visit in order to better assess preoperative cardiovascular risk.  Pre-op covering staff: - Please schedule  Office appointment and call patient to inform them. - Please contact requesting surgeon's office via preferred method (i.e, phone, fax) to inform them of need for appointment prior to surgery.  If applicable, this message will also be routed to pharmacy pool and/or primary cardiologist for input on holding anticoagulant/antiplatelet agent as requested below so that this information is available at time of patient's appointment.   Ronney Asters, NP  10/23/2022, 11:11 AM

## 2022-12-19 NOTE — Progress Notes (Signed)
Cardiology Office Note:    Date:  12/26/2022   ID:  Drew Cisneros, DOB 1940-05-12, MRN [id number]  PCP:  Leane Call, PA-C   [facility] Providers Cardiologist:  None     Referring MD: Irena Reichmann, DO   Chief Complaint  Patient presents with   Coronary Artery Disease    History of Present Illness:    Drew Cisneros is a 82 y.o. male seen at the request of Dr Thomasena Edis for pre op clearance for shoulder surgery. Last seen by me in Jan 2019. He has a history of coronary disease. In 2010 we attempted to perform a PCI on the RCA. This was unsuccessful. He has been managed medically since then.Last Myoview in 2016 showed a small inferior scar without ischemia and EF 67%. He also has enlargement of the thoracic aorta and was followed every other year by Dr. Laneta Simmers. Last CT in August 2022.   He has hypertriglyceridemia but is intolerant of multiple lipid lowering drugs including fenofibrate, Trilipix, crestor, Zocor, Mevacor, and Lovaza. Also intolerant of metoprolol. Has been on Cardura and propranolol for BP control.   He was seeing Dr Dion Saucier for a shoulder problem and was considering surgery but he has decided not to have surgery. He also hasn't followed up with Dr Laneta Simmers- states he couldn't afford the scans and doesn't want surgery for his aorta anyway. He is still active. Mows his own yard. Denies any chest pain, dyspnea, dizziness. Has chronic swelling of right lower leg.    Past Medical History:  Diagnosis Date   AAA (abdominal aortic aneurysm) (HCC)    S/P AAA repair. Infra renal AAA measures 4.0 cm in August of 2012   Coronary artery disease    a. Unsuccessful PCI to RCA in 2010 --> medically managed. b. 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 Edilia Bo   Renal artery stenosis [facility])    Left per cardiac cath in 2010   Thoracic aortic aneurysm [facility])    followed by Dr. Laneta Simmers   Thrombocytopenia [facility])     Past Surgical History:  Procedure Laterality Date   ABDOMINAL AORTIC ANEURYSM REPAIR     CARDIAC CATHETERIZATION  2010   EF 60% with single vessel CAD. He underwent attempt at PCI  with dissection and had successful stenting of the prox RCA but unsuccessful PCI to the mid RCA. Mild to moderate disease in the left system noted.    CHOLECYSTECTOMY     ILIAC ARTERY ANEURYSM REPAIR     INGUINAL HERNIA REPAIR     LUMBAR SPINE SURGERY     ROTATOR CUFF REPAIR W/ DISTAL CLAVICLE EXCISION     UMBILICAL HERNIA REPAIR      Current Medications: Current Meds  Medication Sig   diazepam (VALIUM) 5 MG tablet Take 2.5 mg by mouth at bedtime as needed.   doxazosin (CARDURA) 8 MG tablet Take 1 tablet (8 mg total) by mouth  at bedtime.   propranolol (INDERAL) 40 MG tablet 40 mg. Take 40 mg 3-4 times day     Allergies:   Aspirin, Atenolol, Codeine, Compazine, Fenofibrate, Lovaza [omega-3-acid ethyl esters], Penicillins, Prednisone, and Zetia [ezetimibe]   Social History   Socioeconomic History   Marital status: Married    Spouse name: Not on file   Number of children: 2   Years of education: Not on file   Highest education level: Not on file  Occupational History   Occupation: Air traffic controller: RETIRED    Comment: retired  Tobacco Use   Smoking status: Former    Current packs/day: 0.00    Types: Cigarettes    Quit date: 07/21/1990    Years since quitting: 32.4   Smokeless tobacco: Never  Substance and Sexual Activity   Alcohol use: No   Drug use: No   Sexual activity: Not  on file  Other Topics Concern   Not on file  Social History Narrative   Not on file   Social Determinants of Health   Financial Resource Strain: Not on file  Food Insecurity: Not on file  Transportation Needs: Not on file  Physical Activity: Not on file  Stress: Not on file  Social Connections: Not on file     Family History: The patient's family history includes Coronary artery disease in his brother; Diabetes in his brother and mother.  ROS:   Please see the history of present illness.     All other systems reviewed and are negative.  EKGs/Labs/Other Studies Reviewed:    The following studies were reviewed today: EKG Interpretation Date/Time:  Wednesday December 26 2022 08:33:41 EDT Ventricular Rate:  66 PR Interval:  172 QRS Duration:  98 QT Interval:  412 QTC Calculation: 431 R Axis:   -18  Text Interpretation: Normal sinus rhythm Minimal voltage criteria for LVH, may be normal variant ( R in aVL ) When compared with ECG of 17-Nov-2008 07:22, Nonspecific T wave abnormality no longer evident in Lateral leads Confirmed by [place], Liliya Fullenwider [phone]) on 12/26/2022 8:38:29 AM  Recent Labs: No results found for requested labs within last 365 days.  Recent Lipid Panel    Component Value Date/Time   CHOL 115 07/25/2010 0816   TRIG 481.0 (H) 07/25/2010 0816   HDL 23.70 (L) 07/25/2010 0816   CHOLHDL 5 07/25/2010 0816   VLDL 96.2 (H) 07/25/2010 0816   LDLDIRECT 38.3 07/25/2010 0816   Dated 11/06/22: cholesterol 127, triglycerides 144, HDL 29, LDL 75. Creatinine 1.24 with GFR 58. Otherwise CMET normal. Hgb 12.6. A1c 5.8%. plts 90K.   Risk Assessment/Calculations:                Physical Exam:    VS:  BP 124/76   Pulse 66   Ht 5\' 8"  (1.727 m)   Wt 147 lb 3.2 oz (66.8 kg)   SpO2 96%   BMI 22.38 kg/m     Wt Readings from Last 3 Encounters:  12/26/22 147 lb 3.2 oz (66.8 kg)  11/23/21 147 lb (66.7 kg)  10/26/20 154 lb (69.9 kg)     GEN:  Well nourished, well  developed in no acute distress HEENT: Normal NECK: No JVD; No carotid bruits LYMPHATICS: No lymphadenopathy CARDIAC: RRR, gr 2/6 systolic murmur RUSB and apex.  RESPIRATORY:  Clear to auscultation without rales, wheezing or rhonchi  ABDOMEN: Soft, non-tender, non-distended MUSCULOSKELETAL:  No edema; No deformity  SKIN: Warm and dry NEUROLOGIC:  Alert and oriented  x 3 PSYCHIATRIC:  Normal affect   ASSESSMENT:    1. Pre-operative clearance   2. Coronary artery disease involving native coronary artery of native heart without angina pectoris   3. Primary hypertension   4. Aneurysm of descending thoracic aorta without rupture (HCC)    PLAN:    In order of problems listed above:  1. Coronary disease with history of failed PCI of the RCA in 2010. Otherwise nonobstructive disease.  Myoview study in Jan. 2016 showed a small inferolateral scar without ischemia. He is asymptomatic. Will continue beta blocker.  2. Dyslipidemia with predominant hypertriglyceridemia and low HDL.  Intolerant of multiple drugs and fish oil. Stressed importance of dietary modifications with weight loss. Avoid simple carbs and sweets.  3. Thoracic aortic aneurysm. 4.3 cm in descending thoracic aorta, 4.7 cm more distally. Last CT 2022. Asymptomatic. Does not want to pursue further evaluation.  4. Hypertension-well controlled. 5. History of ITP. 6. History of PVCs. Asymptomatic.   I will follow up in one year.           Medication Adjustments/Labs and Tests Ordered: Current medicines are reviewed at length with the patient today.  Concerns regarding medicines are outlined above.  Orders Placed This Encounter  Procedures   EKG 12-Lead   No orders of the defined types were placed in this encounter.   There are no Patient Instructions on file for this visit.   Signed, Ah Bott [place], MD  12/26/2022 8:52 AM    [facility]

## 2022-12-26 ENCOUNTER — Ambulatory Visit: Payer: HMO | Attending: Cardiology | Admitting: Cardiology

## 2022-12-26 ENCOUNTER — Encounter: Payer: Self-pay | Admitting: Cardiology

## 2022-12-26 VITALS — BP 124/76 | HR 66 | Ht 68.0 in | Wt 147.2 lb

## 2022-12-26 DIAGNOSIS — I2510 Atherosclerotic heart disease of native coronary artery without angina pectoris: Secondary | ICD-10-CM | POA: Diagnosis not present

## 2022-12-26 DIAGNOSIS — Z01818 Encounter for other preprocedural examination: Secondary | ICD-10-CM

## 2022-12-26 DIAGNOSIS — I7123 Aneurysm of the descending thoracic aorta, without rupture: Secondary | ICD-10-CM | POA: Diagnosis not present

## 2022-12-26 DIAGNOSIS — I10 Essential (primary) hypertension: Secondary | ICD-10-CM | POA: Diagnosis not present

## 2022-12-26 MED ORDER — PROPRANOLOL HCL 40 MG PO TABS: 40.0000 mg | ORAL_TABLET | Freq: Two times a day (BID) | ORAL | 6 refills | Status: DC

## 2022-12-26 MED ORDER — DOXAZOSIN MESYLATE 8 MG PO TABS: 8.0000 mg | ORAL_TABLET | Freq: Every day | ORAL | 3 refills | Status: DC

## 2022-12-26 NOTE — Patient Instructions (Signed)
Medication Instructions:  None Continue all current medications *If you need a refill on your cardiac medications before your next appointment, please call your pharmacy*   Lab Work: none  Testing/Procedures: none   Follow-Up: At [facility], you and your health needs are our priority.  As part of our continuing mission to provide you with exceptional heart care, we have created designated Provider Care Teams.  These Care Teams include your primary Cardiologist (physician) and Advanced Practice Providers (APPs -  Physician Assistants and Nurse Practitioners) who all work together to provide you with the care you need, when you need it.  Your next appointment:   1 year. Please call office in April 2025 to schedule appt. For Oct 2025  Provider:   Dr. [place]

## 2023-05-15 ENCOUNTER — Encounter (HOSPITAL_BASED_OUTPATIENT_CLINIC_OR_DEPARTMENT_OTHER): Payer: Self-pay | Admitting: Emergency Medicine

## 2023-05-15 ENCOUNTER — Ambulatory Visit (HOSPITAL_BASED_OUTPATIENT_CLINIC_OR_DEPARTMENT_OTHER)
Admission: EM | Admit: 2023-05-15 | Discharge: 2023-05-15 | Disposition: A | Discharge status: Home / Self Care | Attending: Family Medicine | Admitting: Family Medicine

## 2023-05-15 DIAGNOSIS — M25572 Pain in left ankle and joints of left foot: Secondary | ICD-10-CM

## 2023-05-15 DIAGNOSIS — M1A0720 Idiopathic chronic gout, left ankle and foot, without tophus (tophi): Secondary | ICD-10-CM

## 2023-05-15 MED ORDER — KETOROLAC TROMETHAMINE 30 MG/ML IJ SOLN
30.0000 mg | Freq: Once | INTRAMUSCULAR | Status: AC
  Administered 2023-05-15: 30 mg via INTRAMUSCULAR

## 2023-05-15 MED ORDER — TRIAMCINOLONE ACETONIDE 40 MG/ML IJ SUSP
40.0000 mg | Freq: Once | INTRAMUSCULAR | Status: AC
  Administered 2023-05-15: 40 mg via INTRAMUSCULAR

## 2023-05-15 NOTE — Discharge Instructions (Signed)
 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  Will treat with Toradol, 30 mg injection today.  Will also treat with Kenalog, 40 mg injection today.  Encouraged ice and elevation of left foot.  Follow-up here if symptoms do not improve, worsen or new symptoms occur.  Follow-up with family doctor as needed.

## 2023-05-15 NOTE — ED Provider Notes (Signed)
 [facility]    CSN: [id number] Arrival date & time: 05/15/23  1029      History   Chief Complaint No chief complaint on file.   HPI Drew Cisneros is a 83 y.o. male.   Pt has a history of gout and he reports he had a flare up start this morning (05/15/23) on his left foot.  The patient has heart disease but does not have diabetes.  He has tried colchicine and allopurinol and has not tolerated them at all.  His doctor normally gives him a shot of Toradol and a shot of Kenalog when he has a gout flareup.  He could not get into see his doctor for a week or more so he came to urgent care hoping to get some shots.  He denies any change in his diet recently.  The pain is mostly in the medial left ankle.  But he does have some left forefoot pain also.     Past Medical History:  Diagnosis Date   AAA (abdominal aortic aneurysm) (HCC)    S/P AAA repair. Infra renal AAA measures 4.0 cm in August of 2012   Coronary artery disease    a. Unsuccessful PCI to RCA in 2010 --> medically managed. b. 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 Edilia Bo   Renal artery stenosis [facility])    Left per cardiac cath in 2010   Thoracic aortic aneurysm [facility])    followed by Dr. Laneta Simmers   Thrombocytopenia [facility])     Patient Active Problem List   Diagnosis Date Noted   History of DVT (deep vein thrombosis) 08/08/2016   Left leg pain 08/08/2016   Right wrist pain 07/23/2016   Healthcare maintenance 11/14/2015   RLQ abdominal pain 01/08/2013    Bradycardia 12/04/2010   Idiopathic thrombocytopenic purpura (HCC)    Thrombocytopenia (HCC)    Nausea    Indigestion    Enlarged thoracic aorta (HCC)    Coronary artery disease    PAD (peripheral artery disease) (HCC)    AAA (abdominal aortic aneurysm) (HCC)    Iliac aneurysm (HCC)    Hypercholesterolemia    Hypertension    Thoracic aortic aneurysm [facility])     Past Surgical History:  Procedure Laterality Date   ABDOMINAL AORTIC ANEURYSM REPAIR     CARDIAC CATHETERIZATION  2010   EF 60% with single vessel CAD. He underwent attempt at PCI  with dissection and had successful stenting of the prox RCA but unsuccessful PCI to the mid RCA. Mild to moderate disease in the left system noted.    CHOLECYSTECTOMY     ILIAC ARTERY ANEURYSM REPAIR     INGUINAL HERNIA REPAIR     LUMBAR SPINE SURGERY     ROTATOR CUFF REPAIR W/ DISTAL CLAVICLE EXCISION     UMBILICAL HERNIA REPAIR         Home Medications    Prior to Admission medications  Medication Sig Start Date End Date Taking? Authorizing Provider  doxazosin (CARDURA) 8 MG tablet Take 1 tablet (8 mg total) by mouth at bedtime. 12/26/22  Yes [place], Peter M, MD  propranolol (INDERAL) 40 MG tablet Take 1 tablet (40 mg total) by mouth 2 (two) times daily. 12/26/22  Yes [place], Peter M, MD  diazepam (VALIUM) 5 MG tablet Take 2.5 mg by mouth at bedtime as needed.    [provider]    Family History Family History  Problem Relation Age of Onset   Diabetes Mother    Diabetes Brother    Coronary artery disease Brother     Social History Social History   Tobacco Use   Smoking status: Former    Current packs/day: 0.00    Types: Cigarettes    Quit date: 07/21/1990    Years since quitting: 32.8   Smokeless tobacco: Never  Substance Use Topics   Alcohol use: No   Drug use: No     Allergies   Aspirin, Atenolol, Codeine, Compazine, Fenofibrate, Lovaza [omega-3-acid ethyl esters (fish)], Penicillins, Prednisone, and  Zetia [ezetimibe]   Review of Systems Review of Systems  Constitutional:  Negative for chills and fever.  HENT:  Negative for ear pain and sore throat.   Eyes:  Negative for pain and visual disturbance.  Respiratory:  Negative for cough.   Cardiovascular:  Negative for chest pain and palpitations.  Gastrointestinal:  Negative for abdominal pain, constipation, diarrhea, nausea and vomiting.  Genitourinary:  Negative for dysuria and hematuria.  Musculoskeletal:  Positive for joint swelling (Pain and swelling of left medial ankle and left forefoot.). Negative for arthralgias and back pain.  Skin:  Negative for color change and rash.  Neurological:  Negative for seizures and syncope.  All other systems reviewed and are negative.    Physical Exam Triage Vital Signs ED Triage Vitals  Encounter Vitals Group     BP 05/15/23 1038 129/76     Systolic BP Percentile --      Diastolic BP Percentile --      Pulse Rate 05/15/23 1038 76     Resp 05/15/23 1038 18     Temp 05/15/23 1038 97.8 F (36.6 C)     Temp Source 05/15/23 1038 Oral     SpO2 05/15/23 1038 94 %     Weight --      Height --      Head Circumference --      Peak Flow --      Pain Score 05/15/23 1036 0     Pain Loc --      Pain Education --      Exclude from Growth Chart --    No data found.  Updated Vital Signs BP 129/76 (BP Location: Right Arm)   Pulse 76   Temp 97.8 F (36.6 C) (Oral)   Resp 18   SpO2 94%   Visual Acuity Right Eye Distance:   Left Eye Distance:   Bilateral Distance:    Right Eye Near:   Left Eye Near:    Bilateral Near:     Physical Exam Vitals and nursing note reviewed.  Constitutional:      General: He is not in acute distress.    Appearance: He is well-developed. He is not ill-appearing or toxic-appearing.  HENT:     Head: Normocephalic and atraumatic.     Right Ear: Hearing, tympanic membrane, ear canal and external ear normal.     Left Ear: Hearing, tympanic  membrane, ear  canal and external ear normal.     Nose: No congestion or rhinorrhea.     Right Sinus: No maxillary sinus tenderness or frontal sinus tenderness.     Left Sinus: No maxillary sinus tenderness or frontal sinus tenderness.     Mouth/Throat:     Lips: Pink.     Mouth: Mucous membranes are moist.     Pharynx: Uvula midline. No oropharyngeal exudate or posterior oropharyngeal erythema.     Tonsils: No tonsillar exudate.  Eyes:     Conjunctiva/sclera: Conjunctivae normal.     Pupils: Pupils are equal, round, and reactive to light.  Cardiovascular:     Rate and Rhythm: Normal rate and regular rhythm.     Pulses:          Dorsalis pedis pulses are 1+ on the right side and 1+ on the left side.       Posterior tibial pulses are 1+ on the right side and 1+ on the left side.     Heart sounds: S1 normal and S2 normal. No murmur heard. Pulmonary:     Effort: Pulmonary effort is normal. No respiratory distress.     Breath sounds: Normal breath sounds. No decreased breath sounds, wheezing, rhonchi or rales.  Abdominal:     General: Bowel sounds are normal.     Palpations: Abdomen is soft.     Tenderness: There is no abdominal tenderness.  Musculoskeletal:        General: No swelling.     Cervical back: Neck supple.     Right lower leg: No tenderness. 1+ Pitting Edema present.     Left lower leg: No tenderness. 1+ Pitting Edema present.     Right ankle: Normal.     Right Achilles Tendon: Normal.     Left ankle: Swelling present. No ecchymosis. Tenderness present.     Left Achilles Tendon: Normal.     Right foot: Normal.     Left foot: Tenderness (Dorsal midfoot.) present. No swelling.  Feet:     Right foot:     Skin integrity: Skin integrity normal.  Lymphadenopathy:     Head:     Right side of head: No submental, submandibular, tonsillar, preauricular or posterior auricular adenopathy.     Left side of head: No submental, submandibular, tonsillar, preauricular or posterior auricular  adenopathy.     Cervical: No cervical adenopathy.     Right cervical: No superficial cervical adenopathy.    Left cervical: No superficial cervical adenopathy.  Skin:    General: Skin is warm and dry.     Capillary Refill: Capillary refill takes less than 2 seconds.     Findings: No rash.  Neurological:     Mental Status: He is alert and oriented to person, place, and time.  Psychiatric:        Mood and Affect: Mood normal.      UC Treatments / Results  Labs (all labs ordered are listed, but only abnormal results are displayed) Labs Reviewed - No data to display  EKG   Radiology No results found.  Procedures Procedures (including critical care time)  Medications Ordered in UC Medications  ketorolac (TORADOL) 30 MG/ML injection 30 mg (30 mg Intramuscular Given 05/15/23 1109)  triamcinolone acetonide (KENALOG-40) injection 40 mg (40 mg Intramuscular Given 05/15/23 1110)    Initial Impression / Assessment and Plan / UC Course  I have reviewed the triage vital signs and the nursing notes.  Pertinent labs &  imaging results that were available during my care of the patient were reviewed by me and considered in my medical decision making (see chart for details).     The patient is not diabetic.  He has not tolerated oral medications for gout.  In the past his family doctor has given him Toradol and Kenalog for gout.  He started with pain in the last 24 hours.  It hurts to walk on his left foot.  Will treat with Kenalog 40 mg IM now and ketorolac 30 mg IM now.  He declined any oral medications.  Encouraged ice and elevation.  Follow-up here or with family doctor if symptoms do not improve, worsen or new symptoms occur. Final Clinical Impressions(s) / UC Diagnoses   Final diagnoses:  Chronic gout of left ankle, unspecified cause  Acute left ankle pain     Discharge Instructions      Exam and history are consistent with gout of the left foot and ankle.  Will treat with  Toradol, 30 mg injection today.  Will also treat with Kenalog, 40 mg injection today.  Encouraged ice and elevation of left foot.  Follow-up here if symptoms do not improve, worsen or new symptoms occur.  Follow-up with family doctor as needed.     ED Prescriptions   None    PDMP not reviewed this encounter.   Prescilla Sours, FNP 05/15/23 1130

## 2023-05-15 NOTE — ED Triage Notes (Signed)
 Pt has a history of gout and he reports he had a flare up start this morning on his left foot.

## 2023-11-25 ENCOUNTER — Other Ambulatory Visit (HOSPITAL_BASED_OUTPATIENT_CLINIC_OR_DEPARTMENT_OTHER): Payer: Self-pay

## 2023-12-21 NOTE — Progress Notes (Unsigned)
 Cardiology Office Note:    Date:  12/27/2023   ID:  LADD CEN, DOB 05-23-1940, MRN [id number]  PCP:  Hunter Mickey Browner, PA-C   [facility] Providers Cardiologist:  None     Referring MD: Hunter Mickey Browner, PA-C   Chief Complaint  Patient presents with   Coronary Artery Disease    History of Present Illness:    Drew Cisneros is a 83 y.o. male seen at for follow up.He has a history of coronary disease. In 2010 we attempted to perform a PCI on the RCA. This was unsuccessful. He has been managed medically since then.Last Myoview  in 2016 showed a small inferior scar without ischemia and EF 67%. He also has enlargement of the thoracic aorta and was followed in the past by Dr. Lucas but has since declined further evaluation. Last CT in August 2022.   He has hypertriglyceridemia but is intolerant of multiple lipid lowering drugs including fenofibrate, Trilipix, crestor, Zocor , Mevacor, and Lovaza.  Has been on Cardura  and propranolol  for BP control. Is concerned that propranolol  formulation has changed.   He is still active. Mows his own yard. Denies any chest pain, dyspnea, dizziness. Has chronic swelling of right lower leg.    Past Medical History:  Diagnosis Date   AAA (abdominal aortic aneurysm)    S/P AAA repair. Infra renal AAA measures 4.0 cm in August of 2012   Coronary artery disease    a. Unsuccessful PCI to RCA in 2010 --> medically managed. b. 2016: NST showing no evidence of ischemia   DVT (deep venous thrombosis) (HCC)    post lumbar surgery   Holter monitor, abnormal October 2012   with only rare PVC and PAC, NO pauses, No significant bradycardia   Hypercholesterolemia    Hypertension    Iliac aneurysm    S/P bilateral iliac repair   Indigestion    ITP (idiopathic thrombocytopenic purpura)    Nausea    Normal nuclear stress test October 2012   EF 72% with no ischemia   PAD (peripheral artery disease)    followed by Dr. Eliza   Renal  artery stenosis    Left per cardiac cath in 2010   Thoracic aortic aneurysm    followed by Dr. Lucas   Thrombocytopenia     Past Surgical History:  Procedure Laterality Date   ABDOMINAL AORTIC ANEURYSM REPAIR     CARDIAC CATHETERIZATION  2010   EF 60% with single vessel CAD. He underwent attempt at PCI  with dissection and had successful stenting of the prox RCA but unsuccessful PCI to the mid RCA. Mild to moderate disease in the left system noted.    CHOLECYSTECTOMY     ILIAC ARTERY ANEURYSM REPAIR     INGUINAL HERNIA REPAIR     LUMBAR SPINE SURGERY     ROTATOR CUFF REPAIR W/ DISTAL CLAVICLE EXCISION     UMBILICAL HERNIA REPAIR      Current Medications: Current Meds  Medication Sig   diazepam (VALIUM) 5 MG tablet Take 2.5 mg by mouth at bedtime as needed.   doxazosin  (CARDURA ) 8 MG tablet Take 1 tablet (8 mg total) by mouth at bedtime.   metoprolol  succinate (TOPROL -XL) 50 MG 24 hr tablet Take 1 tablet (50 mg total) by mouth daily. Take with or immediately following a meal.   [DISCONTINUED] propranolol  (INDERAL ) 40 MG tablet Take 1 tablet (40 mg total) by mouth 2 (two) times daily.     Allergies:  Aspirin, Atenolol, Codeine, Compazine, Fenofibrate, Lovaza [omega-3-acid ethyl esters (fish)], Penicillins, Prednisone, and Zetia [ezetimibe]   Social History   Socioeconomic History   Marital status: Married    Spouse name: Not on file   Number of children: 2   Years of education: Not on file   Highest education level: Not on file  Occupational History   Occupation: Air traffic controller: RETIRED    Comment: retired  Tobacco Use   Smoking status: Former    Current packs/day: 0.00    Types: Cigarettes    Quit date: 07/21/1990    Years since quitting: 33.4   Smokeless tobacco: Never  Substance and Sexual Activity   Alcohol use: No   Drug use: No   Sexual activity: Not on file  Other Topics Concern   Not on file  Social History Narrative   Not on file   Social  Drivers of Health   Financial Resource Strain: Not on file  Food Insecurity: Low Risk  (11/07/2023)   Received from [facility]   Hunger Vital Sign    Within the past 12 months, you worried that your food would run out before you got money to buy more: Never true    Within the past 12 months, the food you bought just didn't last and you didn't have money to get more. : Never true  Transportation Needs: No Transportation Needs (11/07/2023)   Received from [facility]    In the past 12 months, has lack of reliable transportation kept you from medical appointments, meetings, work or from getting things needed for daily living? : No  Physical Activity: Not on file  Stress: Not on file  Social Connections: Not on file     Family History: The patient's family history includes Coronary artery disease in his brother; Diabetes in his brother and mother.  ROS:   Please see the history of present illness.     All other systems reviewed and are negative.  EKGs/Labs/Other Studies Reviewed:    The following studies were reviewed today: EKG Interpretation Date/Time:  Friday December 27 2023 07:54:38 EDT Ventricular Rate:  76 PR Interval:  194 QRS Duration:  106 QT Interval:  386 QTC Calculation: 434 R Axis:   -49  Text Interpretation: Normal sinus rhythm Left anterior fascicular block Moderate voltage criteria for LVH, may be normal variant ( R in aVL , Cornell product ) When compared with ECG of 26-Dec-2022 08:33, QRS axis Shifted left T wave inversion no longer evident in Inferior leads Confirmed by [place], Lillymae Duet [phone]) on 12/27/2023 7:56:28 AM   EKG Interpretation Date/Time:  Friday December 27 2023 07:54:38 EDT Ventricular Rate:  76 PR Interval:  194 QRS Duration:  106 QT Interval:  386 QTC Calculation: 434 R Axis:   -49  Text Interpretation: Normal sinus rhythm Left anterior fascicular block Moderate voltage criteria for LVH, may be normal variant ( R in aVL ,  Cornell product ) When compared with ECG of 26-Dec-2022 08:33, QRS axis Shifted left T wave inversion no longer evident in Inferior leads Confirmed by [place], Steven Veazie [phone]) on 12/27/2023 7:56:28 AM   Recent Labs: No results found for requested labs within last 365 days.  Recent Lipid Panel    Component Value Date/Time   CHOL 115 07/25/2010 0816   TRIG 481.0 (H) 07/25/2010 0816   HDL 23.70 (L) 07/25/2010 0816   CHOLHDL 5 07/25/2010 0816   VLDL 96.2 (H) 07/25/2010 9183  LDLDIRECT 38.3 07/25/2010 0816   Dated 11/06/22: cholesterol 127, triglycerides 144, HDL 29, LDL 75. Creatinine 1.24 with GFR 58. Otherwise CMET normal. Hgb 12.6. A1c 5.8%. plts 90K.  Dated 11/07/23: A1c 5.4%. cholesterol 125, triglycerides 166, HDL 23, LDL 76. CMET normal. Plts 72K. Hgb 12.5.   Risk Assessment/Calculations:                Physical Exam:    VS:  BP 106/66 (BP Location: Right Arm, Cuff Size: Normal)   Pulse 74   Ht 5' 8 (1.727 m)   Wt 139 lb 6 oz (63.2 kg)   SpO2 93%   BMI 21.19 kg/m     Wt Readings from Last 3 Encounters:  12/27/23 139 lb 6 oz (63.2 kg)  12/26/22 147 lb 3.2 oz (66.8 kg)  11/23/21 147 lb (66.7 kg)     GEN:  Well nourished, well developed in no acute distress HEENT: Normal NECK: No JVD; No carotid bruits LYMPHATICS: No lymphadenopathy CARDIAC: RRR, gr 2/6 systolic murmur RUSB and apex.  RESPIRATORY:  Clear to auscultation without rales, wheezing or rhonchi  ABDOMEN: Soft, non-tender, non-distended MUSCULOSKELETAL:  No edema; No deformity  SKIN: Warm and dry NEUROLOGIC:  Alert and oriented x 3 PSYCHIATRIC:  Normal affect   ASSESSMENT:    1. Coronary artery disease involving native coronary artery of native heart without angina pectoris   2. Aneurysm of descending thoracic aorta without rupture   3. Primary hypertension     PLAN:    In order of problems listed above:  1. Coronary disease with history of failed PCI of the RCA in 2010. Otherwise nonobstructive  disease.  Myoview  study in Jan. 2016 showed a small inferolateral scar without ischemia. He is asymptomatic. Will continue beta blocker.  2. Dyslipidemia with predominant hypertriglyceridemia and low HDL.  Intolerant of multiple drugs and fish oil. Stressed importance of dietary modifications with weight loss.  3. Thoracic aortic aneurysm. 4.3 cm in descending thoracic aorta, 4.7 cm more distally. Last CT 2022. Asymptomatic. Does not want to pursue further evaluation.  4. Hypertension-well controlled. Recommend switching propranolol  to Toprol  XL 50 mg daily.  5. History of ITP. No bleeding 6. History of PVCs. Asymptomatic.   I will follow up in one year.           Medication Adjustments/Labs and Tests Ordered: Current medicines are reviewed at length with the patient today.  Concerns regarding medicines are outlined above.  Orders Placed This Encounter  Procedures   EKG 12-Lead   Meds ordered this encounter  Medications   metoprolol  succinate (TOPROL -XL) 50 MG 24 hr tablet    Sig: Take 1 tablet (50 mg total) by mouth daily. Take with or immediately following a meal.    Dispense:  90 tablet    Refill:  3    There are no Patient Instructions on file for this visit.   Signed, Annistyn Depass [place], MD  12/27/2023 8:06 AM    [facility]

## 2023-12-27 ENCOUNTER — Ambulatory Visit: Attending: Cardiology | Admitting: Cardiology

## 2023-12-27 VITALS — BP 106/66 | HR 74 | Ht 68.0 in | Wt 139.4 lb

## 2023-12-27 DIAGNOSIS — I2510 Atherosclerotic heart disease of native coronary artery without angina pectoris: Secondary | ICD-10-CM | POA: Diagnosis not present

## 2023-12-27 DIAGNOSIS — I10 Essential (primary) hypertension: Secondary | ICD-10-CM

## 2023-12-27 DIAGNOSIS — I7123 Aneurysm of the descending thoracic aorta, without rupture: Secondary | ICD-10-CM

## 2023-12-27 MED ORDER — METOPROLOL SUCCINATE ER 50 MG PO TB24: 50.0000 mg | ORAL_TABLET | Freq: Every day | ORAL | 3 refills | Status: DC

## 2023-12-27 NOTE — Patient Instructions (Signed)
 Medication Instructions:  Stop Propranolol   Start Toprol  XL 50 mg daily Continue all other medications *If you need a refill on your cardiac medications before your next appointment, please call your pharmacy*  Lab Work: None ordered  Testing/Procedures: None ordered  Follow-Up: At [facility], you and your health needs are our priority.  As part of our continuing mission to provide you with exceptional heart care, our providers are all part of one team.  This team includes your primary Cardiologist (physician) and Advanced Practice Providers or APPs (Physician Assistants and Nurse Practitioners) who all work together to provide you with the care you need, when you need it.  Your next appointment:  1 year   Call in May to schedule Oct appointment     Provider:  Dr.Jordan   We recommend signing up for the patient portal called MyChart.  Sign up information is provided on this After Visit Summary.  MyChart is used to connect with patients for Virtual Visits (Telemedicine).  Patients are able to view lab/test results, encounter notes, upcoming appointments, etc.  Non-urgent messages can be sent to your provider as well.   To learn more about what you can do with MyChart, go to [email or web address].

## 2023-12-30 ENCOUNTER — Telehealth: Payer: Self-pay | Admitting: Cardiology

## 2023-12-30 NOTE — Telephone Encounter (Signed)
 Returned patient's call. Patient reports he had OV with Dr. Jordan on Friday 10/24 and his propanolol was changed to toprol . He picked up the medication on Saturday and started the medication on Sunday morning. He states a few hours later he started to feel bad with headache and weakness. He checked his BP and SBP was 195. He took his cardura , which he normally takes a bedtime and states it came down to an SBP of 153. He states he took another cardura  about 4 hours after the first dose and it came down more. However, he states he didn't sleep well Sunday night due to headache, so today he took his propanolol instead of toprol  and he feels better. He states he has no symptoms and his BP today is 139/82.   Patient states he would like to continue on his propanolol, he states he thinks the reason his BP was low at his OV on 10/24 was because he had just taken his propanolol and cardura  prior to his appointment with Dr. Jordan, though cardura  is listed at a med he takes at bedtime. Made patient an appointment with KYM Door PA-C to discuss medication regimen on 01/01/24. Discussed ED precautions, patient agrees to go to ED if headache returns or if headache is accompanied by weakness or stroke symptoms.

## 2023-12-30 NOTE — Telephone Encounter (Signed)
 Pt c/o BP issue: STAT if pt c/o blurred vision, one-sided weakness or slurred speech.  STAT if BP is GREATER than 180/120 TODAY.  STAT if BP is LESS than 90/60 and SYMPTOMATIC TODAY  1. What is your BP concern? Hypertension   2. Have you taken any BP medication today? Yes   3. What are your last 5 BP readings?153/95 BP 78 HR   4. Are you having any other symptoms (ex. Dizziness, headache, blurred vision, passed out)? Headache, feels woozy   Pt states his bp has been very high since starting   metoprolol  succinate (TOPROL -XL) 50 MG 24 hr tablet   After his last visit 10/24. Please advise.

## 2023-12-31 NOTE — Progress Notes (Unsigned)
 Cardiology Office Note:    Date:  01/01/2024   ID:  COOLIDGE Cisneros, DOB [birth year], MRN [id number]  PCP:  Drew Mickey Browner, PA-C  Cardiologist:  Drew Jordan, MD     Referring MD: Drew Mickey Browner, PA-C   Chief Complaint: follow-up of elevated BP  History of Present Illness:    Drew Cisneros is a 83 y.o. 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  Patient had a cardiac catheterization in 11/2008 after an abnormal stress test and this showed mild to moderate luminal irregularities of the LAD, moderate nonobstructive disease of the LCx, and severe 95% stenosis of the proximal RCA followed by 80-90% stenosis of the mid vessel at left-to-right collaterals.  LVEF was normal at 60%.  Abdominal aortic angiogram at this time showed a small infrarenal aneurysm with obstructive ostial right renal artery disease and mild to moderate ostial disease in the left as well as very ectatic iliac arteries bilaterally and an occluded femoral artery on the right.  PCI of the RCA was attempted but was unsuccessful and he has been treated medically since then.  Last ischemic evaluation was a Myoview  in 03/2014 which was low risk with a small mild inferolateral bowel attenuation artifact but no ischemia. EF was 67%.   He also has a history of AAA and bilateral common iliac aneurysms and underwent a repair of both of these remotely in 1995.  He has a known thoracic aortic aneurysm as well.  He was previously followed by Dr. Sherrine for this.  Last CT in 10/2020 showed diffuse aneurysmal dilatation of the thoracic aorta measuring 4.3 cm and then 4.7 cm in the distal descending aorta.  He has since declined further evaluation of this.   He was recently  seen by Dr. Jordan on 12/27/2023 at which time he was still active and mowing his own yard.  He denies any chest pain or shortness of breath.  He reported chronic swelling of his right lower extremity but this was overall stable.  He expressed concern that his Propranolol  formulation had changed in decision was made to ultimately switch him to Toprol -XL.  However, he called our office on 12/30/2023 reporting markedly elevated BP and a headache after making the switch.  Therefore this visit was arranged for further evaluation.  He is here today with his wife.  He states his BP did not tolerate the Metoprolol .  He only took 2 doses of this but his BP spiked both times with systolic BP as high as the 190s-200s.  Therefore, he went back to taking his Propranolol .  He has Propranolol  LA 60 mg once daily listed on his med list but states he actually takes it twice daily.  He was previously on Propranolol  IR 40 mg 3 times daily and this worked really well for him but the company no longer makes the 40mg  tablets.  Last night, he woke up around 2 AM with a headache and tinnitus and his BP was 200/101.  He states he put ice packs on his head and his chest because he felt flushed and took 2 doses of his Propranolol  as well as his Cardura .  His BP dropped to 98/62 after about 1.5 hours later.  He states his  BP goes up and down throughout the day.  He has a wrist cuff and I reviewed the readings on this today.  It looks like systolic BP is occasionally elevated in the 150s-160s but overall it looks well-controlled.  I only saw one reading with systolic BP > 180 and that was from when BP spiked last night.  He denies any other strokelike symptoms with the BP spikes.  He has no cardiac symptoms either.  ROS: No chest pain, shortness of breath, orthopnea, PND, lower extremity edema, palpitations, lightheadedness, dizziness, syncope.   EKGs/Labs/Other Studies Reviewed:    The following studies were reviewed:  Myoview   03/11/2014: Overall Impression:  Low risk stress nuclear study with small, mild inferolateral bowel attenuation artifact. LV Wall Motion:  NL LV Function; NL Wall Motion; EF 67%  EKG:  EKG not ordered today.   Recent Labs: No results found for requested labs within last 365 days.  Recent Lipid Panel    Component Value Date/Time   CHOL 115 07/25/2010 0816   TRIG 481.0 (H) 07/25/2010 0816   HDL 23.70 (L) 07/25/2010 0816   CHOLHDL 5 07/25/2010 0816   VLDL 96.2 (H) 07/25/2010 0816   LDLDIRECT 38.3 07/25/2010 0816    Physical Exam:    Vital Signs: BP 125/72 (BP Location: Left Arm, Patient Position: Sitting, Cuff Size: Normal)   Pulse 87   Resp 16   Ht 5' 8 (1.727 m)   Wt 142 lb 9.6 oz (64.7 kg)   SpO2 93%   BMI 21.68 kg/m     Wt Readings from Last 3 Encounters:  01/01/24 142 lb 9.6 oz (64.7 kg)  12/27/23 139 lb 6 oz (63.2 kg)  12/26/22 147 lb 3.2 oz (66.8 kg)     General: 83 y.o. Caucasian male in no acute distress. HEENT: Normocephalic and atraumatic. Sclera clear.  Neck: Supple. No carotid bruits. No JVD. Heart: RRR. III/VI systolic murmur.  Lungs: No increased work of breathing. Clear to ausculation bilaterally. No wheezes, rhonchi, or rales.  Extremities: No lower extremity edema.  Varicose veins noted. Skin: Warm and dry. Neuro: No focal deficits. Psych: Normal affect. Responds appropriately.  Assessment:    1. Primary hypertension   2. Murmur   3. Coronary artery disease involving native coronary artery of native heart without angina pectoris   4. Thoracic aortic aneurysm without rupture, unspecified part   5. AAA s/p repair   6. Bilateral iliac artery aneurysm s/p repair   7. PAD (peripheral artery disease)   8. Renal artery stenosis   9. Hyperlipidemia, unspecified hyperlipidemia type     Plan:    Hypertension BP is well controlled in office but he reports that his BP waxes and wanes at home. He also describes a a couple of recent BP spikes with  systolic BP in the 190s to 200s. Propranolol  was changed to Toprol -XL at visit last week  (due to a change in his Propranolol  formulation) but this seemed to make his BP worse so he restarted the Propranolol .  - Reviewed home BP log. For the most part, BP looks well controlled especially given his age. He has a couple of systolic readings in the 150s to 160s and then one in the 170s and one in the 200s (this one was last night).  - Will stop Propranolol  and switch to Coreg  12.5mg  twice daily. Discussed this with Pharmacy who agreed.  - Also on Cardura  8mg  daily for BPH. Okay to continue this as well. - Asked patient to  keep a BP/ HR log for 2 weeks and then send this to us . Recommended he get an arm cuff rather than use the wrist cuff.  - Of note, when patient's systolic BP spiked to 200 last night, he took 2 doses of Propranolol  and Cardura  all at once and systolic BP dropped to 98 after 1.5 hours. Encouraged patient not to double up on medications again as a rapid declined in BP like that could potentially cause him pass out.   Murmur He has a murmur on exam.  - I don't see that he has ever had an Echo. He has previously declined routine monitoring of his thoracic aortic aneurysm, but I offered an Echo and he would like to proceed with this. Therefore, will order Echo today.   CAD Remote LHC in 2010 showed severe stenosis of proximal RCA with otherwise non-obstructive disease. PCI of RCA was attempted but was unsuccessful and this was treated medically. Myoview  in 2016 was low risk with no evidence of ischemia.  - No chest pain.  - Continue beta-blocker.  - Intolerant to aspirin and statins/ Zetia.  Thoracic Aortic Aneurysm AAA and Bilateral Common Iliac Artery Aneurysm s/p Remote Repair in 1995 Last CT in 2022 showed a descending thoracic aortic measuring up to 4.3 cm and then 4.7 cm in the distal descending aorta.  - Continue beta-blocker.  - He has declined routine monitoring of this.    PAD  Renal Artery Disease Abdominal aorta angiogram in 2010 showed an obstructive ostial right renal artery disease and mild to moderate ostial disease in the left as well as very ectatic iliac arteries bilaterally and an occluded femoral artery on the right.   - No claudication.  - Intolerant to aspirin and statins/ Zetia.  Hyperlipidemia Patient has a history of hyperlipidemia with predominantly hypertriglyceridemia and low HDL. Last lipid panel in 11/2023: Total Cholesterol 125, Triglycerides 166, HDL 23, LDL 76.  - Intolerant to multiple lipid lowering medications including Fenofibrate, Trilipix, Crestor, Zocor , Mevacor, and Lovaza.  - Given advanced age and multiple medications intolerance, lifestyle modifications are the focus at this point.  - Did not specifically discuss today.   Disposition: Follow up in 2-3 months.   Signed, Aline FORBES Door, PA-C  01/01/2024 6:06 PM    [facility]

## 2024-01-01 ENCOUNTER — Encounter: Payer: Self-pay | Admitting: Student

## 2024-01-01 ENCOUNTER — Ambulatory Visit: Attending: Student | Admitting: Student

## 2024-01-01 VITALS — BP 125/72 | HR 87 | Resp 16 | Ht 68.0 in | Wt 142.6 lb

## 2024-01-01 DIAGNOSIS — I723 Aneurysm of iliac artery: Secondary | ICD-10-CM

## 2024-01-01 DIAGNOSIS — I701 Atherosclerosis of renal artery: Secondary | ICD-10-CM

## 2024-01-01 DIAGNOSIS — I2510 Atherosclerotic heart disease of native coronary artery without angina pectoris: Secondary | ICD-10-CM

## 2024-01-01 DIAGNOSIS — I712 Thoracic aortic aneurysm, without rupture, unspecified: Secondary | ICD-10-CM | POA: Diagnosis not present

## 2024-01-01 DIAGNOSIS — R011 Cardiac murmur, unspecified: Secondary | ICD-10-CM | POA: Diagnosis not present

## 2024-01-01 DIAGNOSIS — I714 Abdominal aortic aneurysm, without rupture, unspecified: Secondary | ICD-10-CM

## 2024-01-01 DIAGNOSIS — E785 Hyperlipidemia, unspecified: Secondary | ICD-10-CM

## 2024-01-01 DIAGNOSIS — I10 Essential (primary) hypertension: Secondary | ICD-10-CM

## 2024-01-01 DIAGNOSIS — I739 Peripheral vascular disease, unspecified: Secondary | ICD-10-CM

## 2024-01-01 MED ORDER — CARVEDILOL 12.5 MG PO TABS: 12.5000 mg | ORAL_TABLET | Freq: Two times a day (BID) | ORAL | 2 refills | Status: DC

## 2024-01-01 NOTE — Patient Instructions (Addendum)
 Thank you for choosing [facility]!     Medication Instructions:  Discontinue the propanolol.  Start Carvedilol  12.5mg . Take 1 tablet twice a day.  Monitor your blood pressure and heart reate.  Record record your readings. You may drop them off the log at the office or mail them in. Make attention to Callie Goodrich  Purchase an Omron blood pressure monitoring machine in order to take your blood pressure using your arm instead of the using wrist cuff.   *If you need a refill on your cardiac medications before your next appointment, please call your pharmacy*   Lab Work: No labs were ordered during today's visit.  If you have labs (blood work) drawn today and your tests are completely normal, you will receive your results only by: MyChart Message (if you have MyChart) OR A paper copy in the mail If you have any lab test that is abnormal or we need to change your treatment, we will call you to review the results.   Testing/Procedures: Your physician has requested that you have an echocardiogram. Echocardiography is a painless test that uses sound waves to create images of your heart. It provides your doctor with information about the size and shape of your heart and how well your heart's chambers and valves are working. This procedure takes approximately one hour. There are no restrictions for this procedure. Please do NOT wear cologne, perfume, aftershave, or lotions (deodorant is allowed). Please arrive 15 minutes prior to your appointment time.  Please note: We ask at that you not bring children with you during ultrasound (echo/ vascular) testing. Due to room size and safety concerns, children are not allowed in the ultrasound rooms during exams. Our front office staff cannot provide observation of children in our lobby area while testing is being conducted. An adult accompanying a patient to their appointment will only be allowed in the ultrasound room at the discretion of  the ultrasound technician under special circumstances. We apologize for any inconvenience.   Your next appointment:   2-3 month(s)    Provider:   Aline Door    Follow-Up: At [facility], you and your health needs are our priority.  As part of our continuing mission to provide you with exceptional heart care, we have created designated Provider Care Teams.  These Care Teams include your primary Cardiologist (physician) and Advanced Practice Providers (APPs -  Physician Assistants and Nurse Practitioners) who all work together to provide you with the care you need, when you need it. We recommend signing up for the patient portal called MyChart.  Sign up information is provided on this After Visit Summary.  MyChart is used to connect with patients for Virtual Visits (Telemedicine).  Patients are able to view lab/test results, encounter notes, upcoming appointments, etc.  Non-urgent messages can be sent to your provider as well.   To learn more about what you can do with MyChart, go to [email or web address].

## 2024-01-01 NOTE — Telephone Encounter (Signed)
 See OV note 01/01/24.

## 2024-01-02 ENCOUNTER — Other Ambulatory Visit (HOSPITAL_COMMUNITY): Payer: Self-pay

## 2024-01-02 MED ORDER — OMRON 3 SERIES BP MONITOR DEVI
1.0000 | Freq: Every day | 0 refills | Status: DC
  Filled 2024-01-02: qty 1, 30d supply, fill #0

## 2024-01-02 NOTE — Addendum Note (Signed)
 Addended by: DARRELL BRUCKNER on: 01/02/2024 10:36 AM   Modules accepted: Orders

## 2024-01-06 ENCOUNTER — Telehealth: Payer: Self-pay

## 2024-01-06 ENCOUNTER — Ambulatory Visit (HOSPITAL_COMMUNITY)
Admission: RE | Admit: 2024-01-06 | Discharge: 2024-01-06 | Disposition: A | Discharge status: Home / Self Care | Source: Ambulatory Visit | Attending: Student | Admitting: Student

## 2024-01-06 ENCOUNTER — Telehealth: Payer: Self-pay | Admitting: Cardiology

## 2024-01-06 DIAGNOSIS — R011 Cardiac murmur, unspecified: Secondary | ICD-10-CM

## 2024-01-06 LAB — ECHOCARDIOGRAM COMPLETE
AR max vel: 2.29 cm2
AV Area VTI: 2.61 cm2
AV Area mean vel: 2.32 cm2
AV Mean grad: 8 mmHg
AV Peak grad: 15.1 mmHg
Ao pk vel: 1.94 m/s
Area-P 1/2: 4.08 cm2
P 1/2 time: 573 ms
S' Lateral: 2.7 cm

## 2024-01-06 NOTE — Telephone Encounter (Signed)
 Left message for patient to return the call.

## 2024-01-06 NOTE — Telephone Encounter (Signed)
 This patient in [place] had just had an ECHO done downstairs came up to 5th fl  w elevated BP of 157/105  82  was taken off some meds last week and does not feel so well, patient states his BP has been high since he last saw Jadine on 01-01-24. Patient sitting with wife in waiting area, reports is not feeling well ever since medications were changed. Advised RN that he was going to go back on his Propanolol and wanted to cancel f/u appt with the provider he saw last. Patient then stood saying he needed to leave and walked off - wife stayed behind to explain to RN that he was hungry and it was time to go.

## 2024-01-06 NOTE — Telephone Encounter (Signed)
 Pt c/o medication issue:  1. Name of Medication:   carvedilol  (COREG ) 12.5 MG tablet    2. How are you currently taking this medication (dosage and times per day)?  Take 1 tablet (12.5 mg total) by mouth 2 (two) times daily.       3. Are you having a reaction (difficulty breathing--STAT)? No  4. What is your medication issue? Pt is requesting a callback regarding him stating this medication isn't working for him. He'd like to discuss further with nurse. Please advise

## 2024-01-07 NOTE — Telephone Encounter (Signed)
 Wife is returning call to the nurse

## 2024-01-07 NOTE — Telephone Encounter (Signed)
 Left message to call office.

## 2024-01-07 NOTE — Telephone Encounter (Signed)
 Patient is returning call. He would like a call back to discuss this.

## 2024-01-08 ENCOUNTER — Other Ambulatory Visit (HOSPITAL_COMMUNITY): Payer: Self-pay

## 2024-01-09 ENCOUNTER — Ambulatory Visit: Payer: Self-pay | Admitting: Student

## 2024-01-09 DIAGNOSIS — I712 Thoracic aortic aneurysm, without rupture, unspecified: Secondary | ICD-10-CM

## 2024-01-09 DIAGNOSIS — I7123 Aneurysm of the descending thoracic aorta, without rupture: Secondary | ICD-10-CM

## 2024-01-21 NOTE — Progress Notes (Signed)
 Left message for pt to call bk for echo results

## 2024-01-21 NOTE — Telephone Encounter (Signed)
 Patient and his wife called to discuss echo result. Patient is not willing to do CT at this time.

## 2024-01-22 NOTE — Telephone Encounter (Signed)
 Pts wife requesting a c/b to discuss CT. The pt would like to schedule now but would like more details.

## 2024-01-22 NOTE — Addendum Note (Signed)
 Addended by: VICCI ROXIE CROME on: 01/22/2024 12:58 PM   Modules accepted: Orders

## 2024-01-23 ENCOUNTER — Telehealth: Payer: Self-pay

## 2024-01-23 NOTE — Telephone Encounter (Signed)
-----   Message from [place] J sent at 01/22/2024  1:22 PM EST ----- Regarding: RE: Cost Estimate for CT Angio Chest Good afternoon He will have no out of pocket for the reading as per the estimate, however we are never a 100% until the claim is filed.  There is a hospital charge for the cpt code 28724 TC modifier.  He will need to call the [facility] at [phone] for that estimate. Thank You Sharyne Shawnee Molt ----- Message ----- From: Vicci Roxie CROME, RN Sent: 01/22/2024  12:56 PM EST To: [facility] Pre Cert/Auth; [facility] Subject: Cost Estimate for CT Angio Chest               Good afternoon,  I am not sure if this is something that the billing team can answer, but I told the patient I would reach out and ask. Mr. Juba is asking how much will a CT Angio chest cost him. Are we able to get an estimate for him?  Thank you, Seretha Estabrooks

## 2024-01-23 NOTE — Telephone Encounter (Signed)
 Spoke with patient to follow-up on request for cost estimate for CT angio.  Shared following message with patient: Drew Cisneros MARLA Vicci Roxie LITTIE, RN Good afternoon He will have no out of pocket for the reading as per the estimate, however we are never a 100% until the claim is filed.  There is a hospital charge for the cpt code 28724 TC modifier.  He will need to call the [facility] at [phone] for that estimate. Thank You Cisneros Shaw[place] Drew   Patient verbalized understanding.

## 2024-02-03 ENCOUNTER — Ambulatory Visit (HOSPITAL_COMMUNITY)
Admission: RE | Admit: 2024-02-03 | Discharge: 2024-02-03 | Disposition: A | Source: Ambulatory Visit | Attending: Student

## 2024-02-03 DIAGNOSIS — I712 Thoracic aortic aneurysm, without rupture, unspecified: Secondary | ICD-10-CM | POA: Diagnosis present

## 2024-02-03 DIAGNOSIS — I7123 Aneurysm of the descending thoracic aorta, without rupture: Secondary | ICD-10-CM | POA: Insufficient documentation

## 2024-02-03 MED ORDER — IOHEXOL 350 MG/ML SOLN
75.0000 mL | Freq: Once | INTRAVENOUS | Status: AC | PRN
  Administered 2024-02-03: 75 mL via INTRAVENOUS

## 2024-02-06 ENCOUNTER — Telehealth: Payer: Self-pay | Admitting: Cardiology

## 2024-02-06 NOTE — Telephone Encounter (Signed)
 Wife Nickey) called to follow-up on patient CT scan test results.

## 2024-02-06 NOTE — Telephone Encounter (Signed)
 Informed patient's wife, Drew Cisneros, report is not yet in chart. Provider will review when available and we will follow-up with patient. Drew Cisneros verbalized understanding.

## 2024-02-10 ENCOUNTER — Ambulatory Visit: Payer: Self-pay | Admitting: Student

## 2024-02-10 ENCOUNTER — Telehealth: Payer: Self-pay

## 2024-02-10 DIAGNOSIS — I712 Thoracic aortic aneurysm, without rupture, unspecified: Secondary | ICD-10-CM

## 2024-02-10 DIAGNOSIS — I7123 Aneurysm of the descending thoracic aorta, without rupture: Secondary | ICD-10-CM

## 2024-02-10 NOTE — Progress Notes (Signed)
 Left vm for pt to call back to discuss going to vascular surgery.

## 2024-02-11 NOTE — Progress Notes (Signed)
 Spoke with wife and patient. He did agree to go to Vascular surgery. Referral was placed 02/10/24

## 2024-02-12 NOTE — Telephone Encounter (Signed)
 See 12/8 telephone note already taken care of.

## 2024-02-13 NOTE — Progress Notes (Unsigned)
 Patient ID: Drew Cisneros, male   DOB: January 18, 1941, 83 y.o.   MRN: [id number]  Reason for Consult: No chief complaint on file.   Referred by Goodrich, Callie E, PA-C  Subjective:     HPI Drew Cisneros is a 83 y.o. male presenting for evaluation of thoracic aortic aneurysm and aortic abdominal aortic aneurysm. ***  Past Medical History:  Diagnosis Date   AAA (abdominal aortic aneurysm)    S/P AAA repair. Infra renal AAA measures 4.0 cm in August of 2012   Coronary artery disease    a. Unsuccessful PCI to RCA in 2010 --> medically managed. b. 2016: NST showing no evidence of ischemia   DVT (deep venous thrombosis) (HCC)    post lumbar surgery   Holter monitor, abnormal October 2012   with only rare PVC and PAC, NO pauses, No significant bradycardia   Hypercholesterolemia    Hypertension    Iliac aneurysm    S/P bilateral iliac repair   Indigestion    ITP (idiopathic thrombocytopenic purpura)    Nausea    Normal nuclear stress test October 2012   EF 72% with no ischemia   PAD (peripheral artery disease)    followed by Dr. Eliza   Renal artery stenosis    Left per cardiac cath in 2010   Thoracic aortic aneurysm    followed by Dr. Lucas   Thrombocytopenia    Family History  Problem Relation Age of Onset   Diabetes Mother    Diabetes Brother    Coronary artery disease Brother    Past Surgical History:  Procedure Laterality Date   ABDOMINAL AORTIC ANEURYSM REPAIR     CARDIAC CATHETERIZATION  2010   EF 60% with single vessel CAD. He underwent attempt at PCI  with dissection and had successful stenting of the prox RCA but unsuccessful PCI to the mid RCA. Mild to moderate disease in the left system noted.    CHOLECYSTECTOMY     ILIAC ARTERY ANEURYSM REPAIR     INGUINAL HERNIA REPAIR     LUMBAR SPINE SURGERY     ROTATOR CUFF REPAIR W/ DISTAL CLAVICLE EXCISION     UMBILICAL HERNIA REPAIR      Short Social History:  Social History   Tobacco Use   Smoking  status: Former    Current packs/day: 0.00    Types: Cigarettes    Quit date: 07/21/1990    Years since quitting: 33.5   Smokeless tobacco: Never  Substance Use Topics   Alcohol use: No    Allergies[1]  Current Outpatient Medications  Medication Sig Dispense Refill   Blood Pressure Monitoring (OMRON 3 SERIES BP MONITOR) DEVI Use to self-monitor blood pressure. 1 each 0   carvedilol  (COREG ) 12.5 MG tablet Take 1 tablet (12.5 mg total) by mouth 2 (two) times daily. 30 tablet 2   diazepam (VALIUM) 5 MG tablet Take 2.5 mg by mouth at bedtime as needed.     doxazosin  (CARDURA ) 8 MG tablet Take 1 tablet (8 mg total) by mouth at bedtime. 90 tablet 3   NON FORMULARY Take 1 capsule by mouth daily. Mindful Advantage     No current facility-administered medications for this visit.    REVIEW OF SYSTEMS  All other systems were reviewed and are negative     Objective:  Objective   There were no vitals filed for this visit. There is no height or weight on file to calculate BMI.  Physical Exam General: no acute distress  Cardiac: hemodynamically stable Abdomen: non-tender, palpable pulsatile mass Extremities: no edema, cyanosis or wounds*** Vascular:   Right: ***  Left: ***  Data: CTA chest reviewed There is an approximate 5.6 cm to descending thoracic aortic aneurysm. There is also a perivisceral aortic aneurysm measuring 6.5 x 7 cm The intervening segment from the celiac to the start of the descending thoracic aneurysm appears to be healthy and normal-sized aorta.     Assessment/Plan:   Drew Cisneros is a 83 y.o. male with ***  Recommendations to optimize cardiovascular risk: Abstinence from all tobacco products. Blood glucose control with goal A1c < 7%. Blood pressure control with goal blood pressure < 140/90 mmHg. Lipid reduction therapy with goal LDL-C <55 mg/dL  Aspirin 81mg  PO QD.  Atorvastatin 40-80mg  PO QD (or other high intensity statin therapy).   Drew GORMAN Serve MD [facility]     [1]  Allergies Allergen Reactions   Aspirin    Atenolol Other (See Comments)    Ear issues   Codeine    Compazine    Fenofibrate    Lovaza [Omega-3-Acid Ethyl Esters (Fish)]    Penicillins    Prednisone Other (See Comments)    Increase in BP   Zetia [Ezetimibe]

## 2024-02-14 ENCOUNTER — Encounter: Payer: Self-pay | Admitting: Vascular Surgery

## 2024-02-14 ENCOUNTER — Ambulatory Visit: Attending: Vascular Surgery | Admitting: Vascular Surgery

## 2024-02-14 ENCOUNTER — Other Ambulatory Visit: Payer: Self-pay

## 2024-02-14 VITALS — BP 104/65 | HR 104 | Temp 98.1°F | Resp 20 | Ht 68.0 in | Wt 142.2 lb

## 2024-02-14 DIAGNOSIS — I7123 Aneurysm of the descending thoracic aorta, without rupture: Secondary | ICD-10-CM | POA: Diagnosis not present

## 2024-02-14 DIAGNOSIS — I7162 Paravisceral aneurysm of the thoracoabdominal aorta, without rupture: Secondary | ICD-10-CM | POA: Diagnosis not present

## 2024-02-15 DIAGNOSIS — M79672 Pain in left foot: Secondary | ICD-10-CM

## 2024-02-15 DIAGNOSIS — R2242 Localized swelling, mass and lump, left lower limb: Secondary | ICD-10-CM

## 2024-02-15 DIAGNOSIS — Z79899 Other long term (current) drug therapy: Secondary | ICD-10-CM

## 2024-02-15 DIAGNOSIS — M109 Gout, unspecified: Secondary | ICD-10-CM

## 2024-02-15 MED ORDER — METHYLPREDNISOLONE ACETATE 80 MG/ML IJ SUSP
80.0000 mg | Freq: Once | INTRAMUSCULAR | Status: AC
  Administered 2024-02-15: 09:00:00 80 mg via INTRAMUSCULAR

## 2024-02-15 MED ORDER — TRAMADOL HCL 50 MG PO TABS: 50.0000 mg | ORAL_TABLET | Freq: Four times a day (QID) | ORAL | 0 refills | Status: AC | PRN

## 2024-02-17 ENCOUNTER — Ambulatory Visit (HOSPITAL_COMMUNITY): Payer: Self-pay

## 2024-02-17 ENCOUNTER — Ambulatory Visit (HOSPITAL_COMMUNITY): Admission: RE | Admit: 2024-02-17 | Discharge: 2024-02-17 | Attending: Vascular Surgery

## 2024-02-17 ENCOUNTER — Encounter: Payer: Self-pay | Admitting: Vascular Surgery

## 2024-02-17 DIAGNOSIS — I7123 Aneurysm of the descending thoracic aorta, without rupture: Secondary | ICD-10-CM | POA: Diagnosis present

## 2024-02-17 LAB — COMPREHENSIVE METABOLIC PANEL WITH GFR
ALT: 7 IU/L (ref 0–44)
AST: 16 IU/L (ref 0–40)
Albumin: 4.1 g/dL (ref 3.7–4.7)
Alkaline Phosphatase: 64 IU/L (ref 48–129)
BUN/Creatinine Ratio: 15 (ref 10–24)
BUN: 18 mg/dL (ref 8–27)
Bilirubin Total: 0.5 mg/dL (ref 0.0–1.2)
CO2: 25 mmol/L (ref 20–29)
Calcium: 9.3 mg/dL (ref 8.6–10.2)
Chloride: 104 mmol/L (ref 96–106)
Creatinine, Ser: 1.21 mg/dL (ref 0.76–1.27)
Globulin, Total: 2.2 g/dL (ref 1.5–4.5)
Glucose: 112 mg/dL — ABNORMAL HIGH (ref 70–99)
Potassium: 4 mmol/L (ref 3.5–5.2)
Sodium: 144 mmol/L (ref 134–144)
Total Protein: 6.3 g/dL (ref 6.0–8.5)
eGFR: 59 mL/min/1.73 — ABNORMAL LOW (ref >59)

## 2024-02-17 LAB — CBC WITH DIFFERENTIAL/PLATELET
Basophils Absolute: 0 x10E3/uL (ref 0.0–0.2)
Basos: 0 %
EOS (ABSOLUTE): 0.3 x10E3/uL (ref 0.0–0.4)
Eos: 5 %
Hematocrit: 38.4 % (ref 37.5–51.0)
Hemoglobin: 12 g/dL — ABNORMAL LOW (ref 13.0–17.7)
Immature Grans (Abs): 0 x10E3/uL (ref 0.0–0.1)
Immature Granulocytes: 0 %
Lymphocytes Absolute: 0.7 x10E3/uL (ref 0.7–3.1)
Lymphs: 11 %
MCH: 27.5 pg (ref 26.6–33.0)
MCHC: 31.3 g/dL — ABNORMAL LOW (ref 31.5–35.7)
MCV: 88 fL (ref 79–97)
Monocytes Absolute: 0.4 x10E3/uL (ref 0.1–0.9)
Monocytes: 8 %
Neutrophils Absolute: 4.3 x10E3/uL (ref 1.4–7.0)
Neutrophils: 76 %
Platelets: 93 x10E3/uL — CL (ref 150–450)
RBC: 4.36 x10E6/uL (ref 4.14–5.80)
RDW: 13.1 % (ref 11.6–15.4)
WBC: 5.7 x10E3/uL (ref 3.4–10.8)

## 2024-02-17 LAB — URIC ACID: Uric Acid: 9.5 mg/dL — ABNORMAL HIGH (ref 3.8–8.4)

## 2024-02-17 MED ORDER — IOHEXOL 350 MG/ML SOLN
75.0000 mL | Freq: Once | INTRAVENOUS | Status: AC | PRN
  Administered 2024-02-17: 17:00:00 75 mL via INTRAVENOUS

## 2024-02-19 NOTE — H&P (View-Only) (Signed)
 "  Patient ID: Drew Cisneros, male   DOB: 1940/08/16, 83 y.o.   MRN: [id number]  Reason for Consult: Follow-up   Referred by Nodal, Mickey Browner, PA-C  Subjective:     HPI Drew Cisneros is a 83 y.o. male presenting follow-up.  He had an open distal AAA and iliac repair by Dr. Melvenia in 1995 with an aortobifem.  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  A CT scan of the abdomen/pelvis was obtained and he presents today for discussion of options.  Past Medical History:  Diagnosis Date   AAA (abdominal aortic aneurysm)    S/P AAA repair. Infra renal AAA measures 4.0 cm in August of 2012   CHF (congestive heart failure) (HCC)    Coronary artery disease    a. Unsuccessful PCI to RCA in 2010 --> medically managed. b. 2016: NST showing no evidence of ischemia   DVT (deep venous thrombosis) (HCC)    post lumbar surgery   Gout    Holter monitor, abnormal October 2012   with only rare PVC and PAC, NO pauses, No significant bradycardia   Hypercholesterolemia    Hypertension    Iliac aneurysm    S/P bilateral iliac repair   Indigestion    ITP (idiopathic thrombocytopenic purpura)    Nausea    Normal nuclear stress test October 2012   EF 72% with no ischemia   PAD (peripheral artery disease)    followed by Dr. Eliza   Renal artery stenosis    Left per cardiac cath in 2010   Thoracic aortic aneurysm    followed by Dr. Lucas   Thrombocytopenia    Family History  Problem Relation Age of Onset   Diabetes Mother    Diabetes Brother    Coronary artery disease Brother    Past Surgical History:  Procedure Laterality Date   ABDOMINAL AORTIC ANEURYSM REPAIR     CARDIAC CATHETERIZATION  2010   EF 60% with single vessel CAD. He underwent attempt at PCI  with dissection and had successful stenting of the prox RCA but unsuccessful PCI to the mid RCA. Mild to moderate disease in the left  system noted.    CHOLECYSTECTOMY     ILIAC ARTERY ANEURYSM REPAIR     INGUINAL HERNIA REPAIR     LUMBAR SPINE SURGERY     ROTATOR CUFF REPAIR W/ DISTAL CLAVICLE EXCISION     UMBILICAL HERNIA REPAIR      Short Social History:  Social History   Tobacco Use   Smoking status: Former    Current packs/day: 0.00    Types: Cigarettes    Quit date: 1995    Years since quitting: 30.9   Smokeless tobacco: Never  Substance Use Topics   Alcohol use: No    Allergies[1]  Current Outpatient Medications  Medication Sig Dispense Refill   carvedilol  (COREG ) 12.5 MG tablet Take 12.5 mg by mouth 2 (two) times daily.     diazepam (VALIUM) 5 MG tablet Take 5 mg by mouth daily.     diazepam (VALIUM) 5 MG tablet Take 5 mg by mouth at bedtime.     doxazosin  (CARDURA ) 8 MG tablet Take 1 tablet (8 mg total) by mouth at bedtime. 90 tablet 3   doxazosin  (CARDURA ) 8 MG tablet Take 8 mg by mouth 2 (two) times daily.     metoprolol  succinate (  TOPROL -XL) 50 MG 24 hr tablet Take 50 mg by mouth daily.     propranolol  (INDERAL ) 60 MG tablet Take 60 mg by mouth 2 (two) times daily.     propranolol  ER (INDERAL  LA) 60 MG 24 hr capsule Take 60 mg by mouth 2 (two) times daily.     traMADol  (ULTRAM ) 50 MG tablet Take 1 tablet (50 mg total) by mouth every 6 (six) hours as needed. 15 tablet 0   No current facility-administered medications for this visit.    REVIEW OF SYSTEMS  All other systems were reviewed and are negative     Objective:  Objective   Vitals:   02/21/24 0855  BP: 101/65  Pulse: 67  Resp: 20  Temp: (!) 97.3 F (36.3 C)  TempSrc: Temporal  SpO2: 96%  Weight: 140 lb 4.8 oz (63.6 kg)  Height: 5' 8 (1.727 m)   Body mass index is 21.33 kg/m.  Physical Exam General: no acute distress Cardiac: hemodynamically stable Vascular:   Right: Palpable femoral  Left: Palpable femoral  Data: CTA reviewed, aortobiiliac present and is widely patent.  The proximal anastomosis appears to be a  few centimeters below the renal arteries.  There is significant aneurysmal dilation in the pararenal segment.  Patent right hypogastric artery, occluded left     Assessment/Plan:   Drew Cisneros is a 83 y.o. male with aneurysmal degeneration of the perivisceral aorta above the previous open aneurysm repair.  The segment measures approximately 6.5 x 7 cm.  He also has a known ascending aortic aneurysm measuring 4 cm and a descending thoracic aneurysm measuring 5.1 cm. The area in the visceral segment is at high risk of rupture which would likely cause a death.  The repeat CT scan demonstrated a patent aortobiiliac bypass.   I explained that this would require a physician modified custom graft with 4 fenestrations for the celiac, SMA and renals.  We discussed the possible risks of renal ischemia, bowel ischemia, rupture and death.  I explained that without surgery his risk of rupture is greater than 20 %/year. He elected to proceed with custom fenestration endovascular aortic repair. Plan to schedule for 03/03/2024 in the Cath Lab    [name] Serve MD [facility]      [1]  Allergies Allergen Reactions   Codeine Other (See Comments)    Severe Headaches   Penicillins Rash   Aspirin    Atenolol Other (See Comments)    Ear issues   Codeine    Compazine    Fenofibrate    Lovaza [Omega-3-Acid Ethyl Esters (Fish)]    Penicillins    Prednisone Other (See Comments)    Increase in BP   Zetia [Ezetimibe]    "

## 2024-02-19 NOTE — Progress Notes (Signed)
 Patient was treated with Depo-Medrol  and tramadol .  Patient was contacted by Alfonso Bailey, RN and his wife reported that he was doing well.  No further treatment needed.  Labs are consistent with gout.  Kidney function is borderline for CKD stage III.

## 2024-02-19 NOTE — Progress Notes (Unsigned)
 Patient ID: Drew Cisneros, male   DOB: 1940/11/25, 83 y.o.   MRN: [id number]  Reason for Consult: No chief complaint on file.   Referred by Nodal, Mickey Browner, PA-C  Subjective:     HPI Drew Cisneros is a 83 y.o. male presenting follow-up.  He had an open distal AAA and iliac repair by Dr. Melvenia in 1995 with an aortobifem.  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  A CT scan of the abdomen/pelvis was obtained and he presents today for discussion of options.  Past Medical History:  Diagnosis Date   AAA (abdominal aortic aneurysm)    S/P AAA repair. Infra renal AAA measures 4.0 cm in August of 2012   CHF (congestive heart failure) (HCC)    Coronary artery disease    a. Unsuccessful PCI to RCA in 2010 --> medically managed. b. 2016: NST showing no evidence of ischemia   DVT (deep venous thrombosis) (HCC)    post lumbar surgery   Gout    Holter monitor, abnormal October 2012   with only rare PVC and PAC, NO pauses, No significant bradycardia   Hypercholesterolemia    Hypertension    Iliac aneurysm    S/P bilateral iliac repair   Indigestion    ITP (idiopathic thrombocytopenic purpura)    Nausea    Normal nuclear stress test October 2012   EF 72% with no ischemia   PAD (peripheral artery disease)    followed by Dr. Eliza   Renal artery stenosis    Left per cardiac cath in 2010   Thoracic aortic aneurysm    followed by Dr. Lucas   Thrombocytopenia    Family History  Problem Relation Age of Onset   Diabetes Mother    Diabetes Brother    Coronary artery disease Brother    Past Surgical History:  Procedure Laterality Date   ABDOMINAL AORTIC ANEURYSM REPAIR     CARDIAC CATHETERIZATION  2010   EF 60% with single vessel CAD. He underwent attempt at PCI  with dissection and had successful stenting of the prox RCA but unsuccessful PCI to the mid  RCA. Mild to moderate disease in the left system noted.    CHOLECYSTECTOMY     ILIAC ARTERY ANEURYSM REPAIR     INGUINAL HERNIA REPAIR     LUMBAR SPINE SURGERY     ROTATOR CUFF REPAIR W/ DISTAL CLAVICLE EXCISION     UMBILICAL HERNIA REPAIR      Short Social History:  Social History   Tobacco Use   Smoking status: Former    Current packs/day: 0.00    Types: Cigarettes    Quit date: 1995    Years since quitting: 30.9   Smokeless tobacco: Never  Substance Use Topics   Alcohol use: No    Allergies[1]  Current Outpatient Medications  Medication Sig Dispense Refill   carvedilol  (COREG ) 12.5 MG tablet Take 12.5 mg by mouth 2 (two) times daily.     diazepam (VALIUM) 5 MG tablet Take 5 mg by mouth daily.     diazepam (VALIUM) 5 MG tablet Take 5 mg by mouth at bedtime.     doxazosin  (CARDURA ) 8 MG tablet Take 1 tablet (8 mg total) by mouth at bedtime. 90 tablet 3   doxazosin  (CARDURA ) 8 MG tablet Take 8 mg by mouth 2 (two) times daily.  metoprolol  succinate (TOPROL -XL) 50 MG 24 hr tablet Take 50 mg by mouth daily.     propranolol  (INDERAL ) 60 MG tablet Take 60 mg by mouth 2 (two) times daily.     propranolol  ER (INDERAL  LA) 60 MG 24 hr capsule Take 60 mg by mouth 2 (two) times daily.     traMADol  (ULTRAM ) 50 MG tablet Take 1 tablet (50 mg total) by mouth every 6 (six) hours as needed. 15 tablet 0   No current facility-administered medications for this visit.    REVIEW OF SYSTEMS  All other systems were reviewed and are negative     Objective:  Objective   There were no vitals filed for this visit. There is no height or weight on file to calculate BMI.  Physical Exam General: no acute distress Cardiac: hemodynamically stable Abdomen: non-tender, no pulsatile mass*** Extremities: no edema, cyanosis or wounds*** Vascular:   Right: ***  Left: ***  Data: CTA reviewed, aortobifem present and is widely patent.  It appears to be solid a few centimeters  below the renal arteries.  There is significant aneurysmal dilation in the pararenal segment.     Assessment/Plan:   Drew Cisneros is a 83 y.o. male with ***  Recommendations to optimize cardiovascular risk: Abstinence from all tobacco products. Blood glucose control with goal A1c < 7%. Blood pressure control with goal blood pressure < 140/90 mmHg. Lipid reduction therapy with goal LDL-C <55 mg/dL  Aspirin 81mg  PO QD.  Atorvastatin 40-80mg  PO QD (or other high intensity statin therapy).   Drew GORMAN Serve Cisneros [facility] [place]       [1] Allergies Allergen Reactions   Codeine Other (See Comments)    Severe Headaches   Penicillins Rash   Aspirin    Atenolol Other (See Comments)    Ear issues   Codeine    Compazine    Fenofibrate    Lovaza [Omega-3-Acid Ethyl Esters (Fish)]    Penicillins    Prednisone Other (See Comments)    Increase in BP   Zetia [Ezetimibe]

## 2024-02-21 ENCOUNTER — Ambulatory Visit: Attending: Vascular Surgery | Admitting: Vascular Surgery

## 2024-02-21 ENCOUNTER — Encounter: Payer: Self-pay | Admitting: Vascular Surgery

## 2024-02-21 VITALS — BP 101/65 | HR 67 | Temp 97.3°F | Resp 20 | Ht 68.0 in | Wt 140.3 lb

## 2024-02-21 DIAGNOSIS — I7123 Aneurysm of the descending thoracic aorta, without rupture: Secondary | ICD-10-CM

## 2024-02-21 DIAGNOSIS — I7162 Paravisceral aneurysm of the thoracoabdominal aorta, without rupture: Secondary | ICD-10-CM | POA: Diagnosis not present

## 2024-02-24 NOTE — Telephone Encounter (Signed)
 Closing open encounter note.

## 2024-02-25 ENCOUNTER — Other Ambulatory Visit: Payer: Self-pay

## 2024-02-25 DIAGNOSIS — I7123 Aneurysm of the descending thoracic aorta, without rupture: Secondary | ICD-10-CM

## 2024-03-03 ENCOUNTER — Inpatient Hospital Stay (HOSPITAL_COMMUNITY): Payer: Self-pay | Admitting: Certified Registered Nurse Anesthetist

## 2024-03-03 ENCOUNTER — Other Ambulatory Visit: Payer: Self-pay

## 2024-03-03 ENCOUNTER — Inpatient Hospital Stay (HOSPITAL_COMMUNITY)

## 2024-03-03 ENCOUNTER — Inpatient Hospital Stay (HOSPITAL_COMMUNITY)
Admission: RE | Disposition: A | Discharge status: Home / Self Care | Payer: Self-pay | Source: Home / Self Care | Attending: Vascular Surgery

## 2024-03-03 ENCOUNTER — Inpatient Hospital Stay (HOSPITAL_COMMUNITY)
Admission: RE | Admit: 2024-03-03 | Discharge: 2024-03-04 | DRG: 269 | Disposition: A | Discharge status: Home / Self Care | Attending: Vascular Surgery | Admitting: Vascular Surgery

## 2024-03-03 DIAGNOSIS — Z880 Allergy status to penicillin: Secondary | ICD-10-CM

## 2024-03-03 DIAGNOSIS — I2510 Atherosclerotic heart disease of native coronary artery without angina pectoris: Secondary | ICD-10-CM | POA: Diagnosis present

## 2024-03-03 DIAGNOSIS — I714 Abdominal aortic aneurysm, without rupture, unspecified: Principal | ICD-10-CM | POA: Diagnosis present

## 2024-03-03 DIAGNOSIS — Z833 Family history of diabetes mellitus: Secondary | ICD-10-CM | POA: Diagnosis not present

## 2024-03-03 DIAGNOSIS — Z79899 Other long term (current) drug therapy: Secondary | ICD-10-CM

## 2024-03-03 DIAGNOSIS — D62 Acute posthemorrhagic anemia: Secondary | ICD-10-CM | POA: Diagnosis not present

## 2024-03-03 DIAGNOSIS — I7162 Paravisceral aneurysm of the thoracoabdominal aorta, without rupture: Secondary | ICD-10-CM | POA: Diagnosis not present

## 2024-03-03 DIAGNOSIS — I771 Stricture of artery: Secondary | ICD-10-CM | POA: Diagnosis not present

## 2024-03-03 DIAGNOSIS — Z8249 Family history of ischemic heart disease and other diseases of the circulatory system: Secondary | ICD-10-CM

## 2024-03-03 DIAGNOSIS — Z888 Allergy status to other drugs, medicaments and biological substances status: Secondary | ICD-10-CM | POA: Diagnosis not present

## 2024-03-03 DIAGNOSIS — Z86718 Personal history of other venous thrombosis and embolism: Secondary | ICD-10-CM | POA: Diagnosis not present

## 2024-03-03 DIAGNOSIS — M109 Gout, unspecified: Secondary | ICD-10-CM | POA: Diagnosis present

## 2024-03-03 DIAGNOSIS — Z885 Allergy status to narcotic agent status: Secondary | ICD-10-CM

## 2024-03-03 DIAGNOSIS — I739 Peripheral vascular disease, unspecified: Secondary | ICD-10-CM | POA: Diagnosis present

## 2024-03-03 DIAGNOSIS — I701 Atherosclerosis of renal artery: Secondary | ICD-10-CM | POA: Diagnosis present

## 2024-03-03 DIAGNOSIS — E7800 Pure hypercholesterolemia, unspecified: Secondary | ICD-10-CM | POA: Diagnosis present

## 2024-03-03 DIAGNOSIS — Z87891 Personal history of nicotine dependence: Secondary | ICD-10-CM

## 2024-03-03 DIAGNOSIS — Z9889 Other specified postprocedural states: Secondary | ICD-10-CM | POA: Diagnosis not present

## 2024-03-03 DIAGNOSIS — Z95828 Presence of other vascular implants and grafts: Secondary | ICD-10-CM | POA: Diagnosis not present

## 2024-03-03 DIAGNOSIS — I7121 Aneurysm of the ascending aorta, without rupture: Secondary | ICD-10-CM | POA: Diagnosis present

## 2024-03-03 DIAGNOSIS — I110 Hypertensive heart disease with heart failure: Secondary | ICD-10-CM | POA: Diagnosis present

## 2024-03-03 DIAGNOSIS — I7123 Aneurysm of the descending thoracic aorta, without rupture: Secondary | ICD-10-CM

## 2024-03-03 HISTORY — PX: ABDOMINAL AORTIC ENDOVASCULAR FENESTRATED STENT GRAFT: CATH118333

## 2024-03-03 LAB — COMPREHENSIVE METABOLIC PANEL WITH GFR
ALT: 9 U/L (ref 0–44)
AST: 19 U/L (ref 15–41)
Albumin: 4.2 g/dL (ref 3.5–5.0)
Alkaline Phosphatase: 73 U/L (ref 38–126)
Anion gap: 9 (ref 5–15)
BUN: 23 mg/dL (ref 8–23)
CO2: 26 mmol/L (ref 22–32)
Calcium: 9.2 mg/dL (ref 8.9–10.3)
Chloride: 105 mmol/L (ref 98–111)
Creatinine, Ser: 1.04 mg/dL (ref 0.61–1.24)
GFR, Estimated: >60 mL/min
Glucose, Bld: 92 mg/dL (ref 70–99)
Potassium: 4 mmol/L (ref 3.5–5.1)
Sodium: 140 mmol/L (ref 135–145)
Total Bilirubin: 0.4 mg/dL (ref 0.0–1.2)
Total Protein: 6.7 g/dL (ref 6.5–8.1)

## 2024-03-03 LAB — CBC
HCT: 37.5 % — ABNORMAL LOW (ref 39.0–52.0)
HCT: 31.8 % — ABNORMAL LOW (ref 39.0–52.0)
HCT: 32.9 % — ABNORMAL LOW (ref 39.0–52.0)
Hemoglobin: 12.1 g/dL — ABNORMAL LOW (ref 13.0–17.0)
Hemoglobin: 10.6 g/dL — ABNORMAL LOW (ref 13.0–17.0)
Hemoglobin: 10.8 g/dL — ABNORMAL LOW (ref 13.0–17.0)
MCH: 27.9 pg (ref 26.0–34.0)
MCH: 28.3 pg (ref 26.0–34.0)
MCH: 28.1 pg (ref 26.0–34.0)
MCHC: 32.3 g/dL (ref 30.0–36.0)
MCHC: 33.3 g/dL (ref 30.0–36.0)
MCHC: 32.8 g/dL (ref 30.0–36.0)
MCV: 86.4 fL (ref 80.0–100.0)
MCV: 85 fL (ref 80.0–100.0)
MCV: 85.5 fL (ref 80.0–100.0)
Platelets: 81 K/uL — ABNORMAL LOW (ref 150–400)
Platelets: 70 K/uL — ABNORMAL LOW (ref 150–400)
Platelets: 73 K/uL — ABNORMAL LOW (ref 150–400)
RBC: 4.34 MIL/uL (ref 4.22–5.81)
RBC: 3.74 MIL/uL — ABNORMAL LOW (ref 4.22–5.81)
RBC: 3.85 MIL/uL — ABNORMAL LOW (ref 4.22–5.81)
RDW: 14.4 % (ref 11.5–15.5)
RDW: 14.5 % (ref 11.5–15.5)
RDW: 14.6 % (ref 11.5–15.5)
WBC: 7.4 K/uL (ref 4.0–10.5)
WBC: 9.2 K/uL (ref 4.0–10.5)
WBC: 9.5 K/uL (ref 4.0–10.5)
nRBC: 0 % (ref 0.0–0.2)
nRBC: 0 % (ref 0.0–0.2)
nRBC: 0 % (ref 0.0–0.2)

## 2024-03-03 LAB — CREATININE, SERUM
Creatinine, Ser: 0.98 mg/dL (ref 0.61–1.24)
GFR, Estimated: >60 mL/min

## 2024-03-03 LAB — URINALYSIS, ROUTINE W REFLEX MICROSCOPIC
Bacteria, UA: NONE SEEN
Bilirubin Urine: NEGATIVE
Glucose, UA: NEGATIVE mg/dL
Ketones, ur: NEGATIVE mg/dL
Nitrite: NEGATIVE
Protein, ur: NEGATIVE mg/dL
Specific Gravity, Urine: 1.01 (ref 1.005–1.030)
pH: 5 (ref 5.0–8.0)

## 2024-03-03 LAB — BASIC METABOLIC PANEL WITH GFR
Anion gap: 9 (ref 5–15)
BUN: 20 mg/dL (ref 8–23)
CO2: 23 mmol/L (ref 22–32)
Calcium: 7.9 mg/dL — ABNORMAL LOW (ref 8.9–10.3)
Chloride: 108 mmol/L (ref 98–111)
Creatinine, Ser: 0.91 mg/dL (ref 0.61–1.24)
GFR, Estimated: >60 mL/min
Glucose, Bld: 142 mg/dL — ABNORMAL HIGH (ref 70–99)
Potassium: 4.6 mmol/L (ref 3.5–5.1)
Sodium: 139 mmol/L (ref 135–145)

## 2024-03-03 LAB — PROTIME-INR
INR: 1.1 (ref 0.8–1.2)
INR: 1.3 — ABNORMAL HIGH (ref 0.8–1.2)
Prothrombin Time: 15.1 s (ref 11.4–15.2)
Prothrombin Time: 17.2 s — ABNORMAL HIGH (ref 11.4–15.2)

## 2024-03-03 LAB — TYPE AND SCREEN
ABO/RH(D): O POS
Antibody Screen: NEGATIVE

## 2024-03-03 LAB — SURGICAL PCR SCREEN
MRSA, PCR: NEGATIVE
Staphylococcus aureus: NEGATIVE

## 2024-03-03 LAB — MAGNESIUM: Magnesium: 1.8 mg/dL (ref 1.7–2.4)

## 2024-03-03 LAB — APTT
aPTT: 32 s (ref 24–36)
aPTT: 47 s — ABNORMAL HIGH (ref 24–36)

## 2024-03-03 LAB — ABO/RH: ABO/RH(D): O POS

## 2024-03-03 MED ORDER — BISACODYL 10 MG RE SUPP: 10.0000 mg | Freq: Every day | RECTAL | Status: DC | PRN

## 2024-03-03 MED ORDER — DEXAMETHASONE SOD PHOSPHATE PF 10 MG/ML IJ SOLN
INTRAMUSCULAR | Status: DC | PRN
  Administered 2024-03-03: 10 mg via INTRAVENOUS

## 2024-03-03 MED ORDER — HEPARIN SODIUM (PORCINE) 5000 UNIT/ML IJ SOLN
5000.0000 [IU] | Freq: Three times a day (TID) | INTRAMUSCULAR | Status: DC
  Administered 2024-03-04: 5000 [IU] via SUBCUTANEOUS
  Filled 2024-03-03: qty 1

## 2024-03-03 MED ORDER — CHLORHEXIDINE GLUCONATE CLOTH 2 % EX PADS: 6.0000 | MEDICATED_PAD | Freq: Once | CUTANEOUS | Status: DC

## 2024-03-03 MED ORDER — CHLORHEXIDINE GLUCONATE 0.12 % MT SOLN
OROMUCOSAL | Status: AC
  Filled 2024-03-03: qty 15

## 2024-03-03 MED ORDER — PROPOFOL 10 MG/ML IV BOLUS
INTRAVENOUS | Status: DC | PRN
  Administered 2024-03-03: 140 mg via INTRAVENOUS

## 2024-03-03 MED ORDER — FENTANYL CITRATE (PF) 100 MCG/2ML IJ SOLN
INTRAMUSCULAR | Status: AC
  Filled 2024-03-03: qty 2

## 2024-03-03 MED ORDER — ONDANSETRON HCL 4 MG/2ML IJ SOLN
INTRAMUSCULAR | Status: DC | PRN
  Administered 2024-03-03 (×2): 4 mg via INTRAVENOUS

## 2024-03-03 MED ORDER — HEPARIN SODIUM (PORCINE) 1000 UNIT/ML IJ SOLN
INTRAMUSCULAR | Status: DC | PRN
  Administered 2024-03-03 (×3): 3000 [IU] via INTRAVENOUS
  Administered 2024-03-03: 8000 [IU] via INTRAVENOUS
  Administered 2024-03-03: 4000 [IU] via INTRAVENOUS
  Administered 2024-03-03: 3000 [IU] via INTRAVENOUS
  Administered 2024-03-03: 2000 [IU] via INTRAVENOUS

## 2024-03-03 MED ORDER — ACETAMINOPHEN 325 MG RE SUPP: 325.0000 mg | RECTAL | Status: DC | PRN

## 2024-03-03 MED ORDER — METOPROLOL TARTRATE 5 MG/5ML IV SOLN: 2.5000 mg | INTRAVENOUS | Status: DC | PRN

## 2024-03-03 MED ORDER — PHENYLEPHRINE 80 MCG/ML (10ML) SYRINGE FOR IV PUSH (FOR BLOOD PRESSURE SUPPORT)
PREFILLED_SYRINGE | INTRAVENOUS | Status: DC | PRN
  Administered 2024-03-03: 160 ug via INTRAVENOUS

## 2024-03-03 MED ORDER — VANCOMYCIN HCL IN DEXTROSE 1-5 GM/200ML-% IV SOLN
1000.0000 mg | INTRAVENOUS | Status: AC
  Administered 2024-03-03: 1000 mg via INTRAVENOUS
  Filled 2024-03-03: qty 200

## 2024-03-03 MED ORDER — PROTAMINE SULFATE 10 MG/ML IV SOLN
INTRAVENOUS | Status: DC | PRN
  Administered 2024-03-03: 80 mg via INTRAVENOUS

## 2024-03-03 MED ORDER — SODIUM CHLORIDE 0.9 % IV SOLN: INTRAVENOUS | Status: DC

## 2024-03-03 MED ORDER — PANTOPRAZOLE SODIUM 40 MG PO TBEC
40.0000 mg | DELAYED_RELEASE_TABLET | Freq: Every day | ORAL | Status: DC
  Administered 2024-03-03 – 2024-03-04 (×2): 40 mg via ORAL
  Filled 2024-03-03 (×2): qty 1

## 2024-03-03 MED ORDER — EPHEDRINE SULFATE-NACL 50-0.9 MG/10ML-% IV SOSY
PREFILLED_SYRINGE | INTRAVENOUS | Status: DC | PRN
  Administered 2024-03-03: 10 mg via INTRAVENOUS
  Administered 2024-03-03: 25 mg via INTRAVENOUS

## 2024-03-03 MED ORDER — FENTANYL CITRATE (PF) 250 MCG/5ML IJ SOLN
INTRAMUSCULAR | Status: DC | PRN
  Administered 2024-03-03: 25 ug via INTRAVENOUS
  Administered 2024-03-03: 100 ug via INTRAVENOUS
  Administered 2024-03-03: 25 ug via INTRAVENOUS

## 2024-03-03 MED ORDER — PHENYLEPHRINE HCL-NACL 20-0.9 MG/250ML-% IV SOLN
INTRAVENOUS | Status: DC | PRN
  Administered 2024-03-03: 25 ug/min via INTRAVENOUS

## 2024-03-03 MED ORDER — ASPIRIN 81 MG PO TBEC
81.0000 mg | DELAYED_RELEASE_TABLET | Freq: Every day | ORAL | Status: DC
  Administered 2024-03-04: 81 mg via ORAL
  Filled 2024-03-03: qty 1

## 2024-03-03 MED ORDER — LACTATED RINGERS IV SOLN: INTRAVENOUS | Status: DC

## 2024-03-03 MED ORDER — ACETAMINOPHEN 325 MG PO TABS: 325.0000 mg | ORAL_TABLET | ORAL | Status: DC | PRN

## 2024-03-03 MED ORDER — CLOPIDOGREL BISULFATE 75 MG PO TABS
75.0000 mg | ORAL_TABLET | Freq: Every day | ORAL | Status: DC
  Administered 2024-03-04: 75 mg via ORAL
  Filled 2024-03-03: qty 1

## 2024-03-03 MED ORDER — LIDOCAINE 2% (20 MG/ML) 5 ML SYRINGE
INTRAMUSCULAR | Status: DC | PRN
  Administered 2024-03-03: 60 mg via INTRAVENOUS

## 2024-03-03 MED ORDER — POTASSIUM CHLORIDE CRYS ER 20 MEQ PO TBCR: 40.0000 meq | EXTENDED_RELEASE_TABLET | Freq: Every day | ORAL | Status: DC | PRN

## 2024-03-03 MED ORDER — CEFAZOLIN SODIUM-DEXTROSE 2-4 GM/100ML-% IV SOLN
2.0000 g | Freq: Three times a day (TID) | INTRAVENOUS | Status: AC
  Administered 2024-03-03 – 2024-03-04 (×2): 2 g via INTRAVENOUS
  Filled 2024-03-03 (×2): qty 100

## 2024-03-03 MED ORDER — TRAMADOL HCL 50 MG PO TABS
50.0000 mg | ORAL_TABLET | Freq: Four times a day (QID) | ORAL | Status: DC | PRN
  Administered 2024-03-03: 50 mg via ORAL
  Filled 2024-03-03: qty 1

## 2024-03-03 MED ORDER — DIAZEPAM 5 MG PO TABS
5.0000 mg | ORAL_TABLET | Freq: Two times a day (BID) | ORAL | Status: DC | PRN
  Administered 2024-03-04: 5 mg via ORAL
  Filled 2024-03-03: qty 1

## 2024-03-03 MED ORDER — DEXAMETHASONE SOD PHOSPHATE PF 10 MG/ML IJ SOLN: INTRAMUSCULAR | Status: DC | PRN

## 2024-03-03 MED ORDER — PHENOL 1.4 % MT LIQD: 1.0000 | OROMUCOSAL | Status: DC | PRN

## 2024-03-03 MED ORDER — DOCUSATE SODIUM 100 MG PO CAPS
100.0000 mg | ORAL_CAPSULE | Freq: Every day | ORAL | Status: DC
  Administered 2024-03-04: 100 mg via ORAL
  Filled 2024-03-03: qty 1

## 2024-03-03 MED ORDER — HEPARIN (PORCINE) IN NACL 1000-0.9 UT/500ML-% IV SOLN
INTRAVENOUS | Status: DC | PRN
  Administered 2024-03-03: 1500 mL

## 2024-03-03 MED ORDER — DROPERIDOL 2.5 MG/ML IJ SOLN
0.6250 mg | Freq: Once | INTRAMUSCULAR | Status: DC | PRN
  Filled 2024-03-03: qty 2

## 2024-03-03 MED ORDER — IODIXANOL 320 MG/ML IV SOLN
INTRAVENOUS | Status: DC | PRN
  Administered 2024-03-03: 170 mL

## 2024-03-03 MED ORDER — SUGAMMADEX SODIUM 200 MG/2ML IV SOLN
INTRAVENOUS | Status: DC | PRN
  Administered 2024-03-03: 122.4 mg via INTRAVENOUS

## 2024-03-03 MED ORDER — PROPRANOLOL HCL ER 60 MG PO CP24
60.0000 mg | ORAL_CAPSULE | Freq: Two times a day (BID) | ORAL | Status: DC
  Administered 2024-03-03 – 2024-03-04 (×2): 60 mg via ORAL
  Filled 2024-03-03 (×3): qty 1

## 2024-03-03 MED ORDER — FENTANYL CITRATE (PF) 100 MCG/2ML IJ SOLN: 25.0000 ug | INTRAMUSCULAR | Status: DC | PRN

## 2024-03-03 MED ORDER — SODIUM CHLORIDE 0.9 % IV SOLN: INTRAVENOUS | Status: DC | PRN

## 2024-03-03 MED ORDER — PROPOFOL 500 MG/50ML IV EMUL
INTRAVENOUS | Status: DC | PRN
  Administered 2024-03-03: 50 ug/kg/min via INTRAVENOUS

## 2024-03-03 MED ORDER — POLYETHYLENE GLYCOL 3350 17 G PO PACK: 17.0000 g | PACK | Freq: Every day | ORAL | Status: DC | PRN

## 2024-03-03 MED ORDER — DOXAZOSIN MESYLATE 8 MG PO TABS
8.0000 mg | ORAL_TABLET | Freq: Two times a day (BID) | ORAL | Status: DC
  Administered 2024-03-03 – 2024-03-04 (×2): 8 mg via ORAL
  Filled 2024-03-03 (×3): qty 1

## 2024-03-03 MED ORDER — HYDROMORPHONE HCL 1 MG/ML IJ SOLN
0.5000 mg | INTRAMUSCULAR | Status: DC | PRN
  Administered 2024-03-03: 0.5 mg via INTRAVENOUS
  Filled 2024-03-03: qty 0.5

## 2024-03-03 MED ORDER — ROCURONIUM BROMIDE 10 MG/ML (PF) SYRINGE
PREFILLED_SYRINGE | INTRAVENOUS | Status: DC | PRN
  Administered 2024-03-03: 50 mg via INTRAVENOUS
  Administered 2024-03-03: 30 mg via INTRAVENOUS
  Administered 2024-03-03: 100 mg via INTRAVENOUS

## 2024-03-03 NOTE — Anesthesia Postprocedure Evaluation (Signed)
"   Anesthesia Post Note  Patient: Drew Cisneros  Procedure(s) Performed: ABDOMINAL AORTIC ENDOVASCULAR FENESTRATED STENT GRAFT     Patient location during evaluation: PACU Anesthesia Type: General Level of consciousness: awake and alert Pain management: pain level controlled Vital Signs Assessment: post-procedure vital signs reviewed and stable Respiratory status: spontaneous breathing, nonlabored ventilation, respiratory function stable and patient connected to nasal cannula oxygen Cardiovascular status: blood pressure returned to baseline and stable Postop Assessment: no apparent nausea or vomiting Anesthetic complications: no   There were no known notable events for this encounter.  Last Vitals:  Vitals:   03/03/24 1730 03/03/24 1745  BP: (!) 141/76 (!) 135/96  Pulse: 74 67  Resp: 14 15  Temp:    SpO2: 91% 100%    Last Pain: There were no vitals filed for this visit.               Thom JONELLE Peoples      "

## 2024-03-03 NOTE — Interval H&P Note (Signed)
 History and Physical Interval Note:  03/03/2024 7:05 AM  Drew Cisneros  has presented today for surgery, with the diagnosis of AAA without rupture.  The various methods of treatment have been discussed with the patient and family. After consideration of risks, benefits and other options for treatment, the patient has consented to  Procedures: ABDOMINAL AORTIC ENDOVASCULAR FENESTRATED STENT GRAFT (N/A) as a surgical intervention.  The patient's history has been reviewed, patient examined, no change in status, stable for surgery.  I have reviewed the patient's chart and labs.  Questions were answered to the patient's satisfaction.     [name] Serve

## 2024-03-03 NOTE — Anesthesia Preprocedure Evaluation (Addendum)
 "                                  Anesthesia Evaluation  Patient identified by MRN, date of birth, ID band Patient awake    Reviewed: Allergy & Precautions, NPO status , Patient's Chart, lab work & pertinent test results  Airway Mallampati: II  TM Distance: >3 FB Neck ROM: Full    Dental no notable dental hx.    Pulmonary former smoker   Pulmonary exam normal        Cardiovascular hypertension, + CAD, + Peripheral Vascular Disease and +CHF   Rhythm:Regular Rate:Normal  ECHO: IMPRESSIONS   1. Left ventricular ejection fraction, by estimation, is 55 to 60%. Left  ventricular ejection fraction by 3D volume is 56 %. The left ventricle has  normal function. The left ventricle has no regional wall motion  abnormalities. There is mild left  ventricular hypertrophy of the basal-septal segment. Left ventricular  diastolic parameters are consistent with Grade I diastolic dysfunction  (impaired relaxation). Elevated left atrial pressure.   2. Right ventricular systolic function is normal. The right ventricular  size is normal. The estimated right ventricular systolic pressure is 25.5  mmHg.   3. The mitral valve is normal in structure. Trivial mitral valve  regurgitation. No evidence of mitral stenosis.   4. The aortic valve is tricuspid. Aortic valve regurgitation is mild.  Aortic valve sclerosis/calcification is present, without any evidence of  aortic stenosis. Aortic regurgitation PHT measures 573 msec. Aortic valve  area, by VTI measures 2.61 cm.  Aortic valve mean gradient measures 8.0 mmHg. Aortic valve Vmax measures  1.94 m/s.   5. Aortic dilatation noted. There is mild dilatation of the aortic root,  measuring 42 mm. There is mild dilatation of the ascending aorta,  measuring 42 mm.   6. The inferior vena cava is normal in size with greater than 50%  respiratory variability, suggesting right atrial pressure of 3 mmHg.      Neuro/Psych negative  neurological ROS  negative psych ROS   GI/Hepatic negative GI ROS, Neg liver ROS,,,  Endo/Other  negative endocrine ROS    Renal/GU negative Renal ROS  negative genitourinary   Musculoskeletal negative musculoskeletal ROS (+)    Abdominal Normal abdominal exam  (+)   Peds  Hematology Lab Results      Component                Value               Date                      WBC                      7.4                 03/03/2024                HGB                      12.1 (L)            03/03/2024                HCT                      37.5 (L)  03/03/2024                MCV                      86.4                03/03/2024                PLT                      81 (L)              03/03/2024             Lab Results      Component                Value               Date                      NA                       140                 03/03/2024                K                        4.0                 03/03/2024                CO2                      26                  03/03/2024                GLUCOSE                  92                  03/03/2024                BUN                      23                  03/03/2024                CREATININE               1.04                03/03/2024                CALCIUM                  9.2                 03/03/2024                GFR                      51.56 (L)           12/04/2010  EGFR                     59 (L)              02/15/2024                GFRNONAA                 >60                 03/03/2024                  Anesthesia Other Findings S/p open repair AAA 1995 with an aortobifem, now with 7 cm aneurysm in the visceral segment and a 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   Reproductive/Obstetrics                              Anesthesia Physical Anesthesia Plan  ASA: 3  Anesthesia Plan: General   Post-op Pain Management:    Induction:  Intravenous  PONV Risk Score and Plan: 2 and Ondansetron , Dexamethasone  and Treatment may vary due to age or medical condition  Airway Management Planned: Mask and Oral ETT  Additional Equipment: None  Intra-op Plan:   Post-operative Plan: Extubation in OR  Informed Consent: I have reviewed the patients History and Physical, chart, labs and discussed the procedure including the risks, benefits and alternatives for the proposed anesthesia with the patient or authorized representative who has indicated his/her understanding and acceptance.     Dental advisory given  Plan Discussed with: CRNA  Anesthesia Plan Comments:          Anesthesia Quick Evaluation  "

## 2024-03-03 NOTE — Anesthesia Procedure Notes (Signed)
 Arterial Line Insertion Start/End12/30/2025 7:00 AM, 03/03/2024 7:10 AM Performed by: Zelphia Norleen HERO, CRNA, CRNA  Patient location: Pre-op. Preanesthetic checklist: patient identified, IV checked, site marked, risks and benefits discussed, surgical consent, monitors and equipment checked, pre-op evaluation, timeout performed and anesthesia consent Lidocaine  1% used for infiltration Left, radial was placed Catheter size: 20 G Hand hygiene performed  and maximum sterile barriers used   Attempts: 2 Following insertion, dressing applied. Post procedure assessment: normal and unchanged  Post procedure complications: unsuccessful attempts. Patient tolerated the procedure well with no immediate complications.

## 2024-03-03 NOTE — Transfer of Care (Addendum)
 Immediate Anesthesia Transfer of Care Note  Patient: Drew Cisneros  Procedure(s) Performed: ABDOMINAL AORTIC ENDOVASCULAR FENESTRATED STENT GRAFT  Patient Location: PACU  Anesthesia Type:General  Level of Consciousness: awake and alert   Airway & Oxygen Therapy: Patient Spontanous Breathing and Patient connected to nasal cannula oxygen  Post-op Assessment: Report given to RN and Post -op Vital signs reviewed and stable  Post vital signs: Reviewed and stable  Last Vitals:  Vitals Value Taken Time  BP 131/83 03/03/24 17:00  Temp 98   Pulse 71 03/03/24 17:07  Resp 17 03/03/24 17:07  SpO2 94 % 03/03/24 17:07  Vitals shown include unfiled device data.  Last Pain: There were no vitals filed for this visit.       Complications: There were no known notable events for this encounter.

## 2024-03-03 NOTE — Op Note (Addendum)
 "   OPERATIVE NOTE  PROCEDURE:   Ultrasound-guided access of bilateral common femoral arteries with Pro-glide preclose technique (65294) Endovascular repair of infrarenal aorta and iliac arteries by aortobiiliac endograft 32-82 TFFB, 20 mm limbs bilaterally (34701 & 33886) Visceral segment and thoracic extension 34-30-159 ZDEG (63754) Selective catheterization of celiac artery  (63754) Selective catheterization of SMA  (63754) Selective catheterization of left renal artery  (63754) Selective catheterization of right renal artery  (62763) Transcatheter placement of stent, celiac artery 8 x 29 VBX, PD 9 mm (62763) Transcatheter placement of stent, SMA 7 x 29 VBX (37236) Trans catheter placement of stent, left renal artery 5 x 19, 6 x 39, 6 x 19 VBX (37236) Trans catheter placement of stent, right renal artery 6 x 39, 6 x 29 VBX Modifier 22  PRE-OPERATIVE DIAGNOSIS: 7cm paravisceral asymptomatic AAA (non-ruptured)  POST-OPERATIVE DIAGNOSIS: same as above   SURGEON: [name] Serve MD  ASSISTANT(S): Lucie Apt, PA  Given the complexity of the case,  the assistant was necessary in order to expedient the procedure and safely perform the technical aspects of the operation.  The assistant played a critical role in pinning wires while and tracking the stent graft devices and advancing the aortic balloon. These skills could not have been adequately performed by a scrub tech assistant.    ANESTHESIA: general  ESTIMATED BLOOD LOSS: 200 cc  FINDING(S): The physician modified device with 2 fenestrations for the celiac and SMA and 2 down with going branches for the bilateral renals was placed in excellent position and the SMA and celiac arteries were easily cannulated and a wire was placed.  Given the significant aortic dilation in the visceral segment affecting the renals and the ostial disease of the arteries, cannulation of the renals was extremely difficult.  Ultimately we were able to  cannulate the right and tract VBX stents with the help of an aortic balloon to support the right renal sheath.  The left was very difficult to cannulate due to the disease at the ostium which ultimately required cannulation from behind the graft in order to pre-stent the left renal artery and then a VBX stent was then placed through the physician made branch into the stent to bridge the gap. The infrarenal piece was then placed below the lowest bridging stent and iliac limbs were placed into the previous limbs of his aortobiiliac bypass graft.  Successful placement of 4 vessel fenestrated aortic stent graft with proximal seal in the thoracic and distal seal within bilateral iliac limbs from his previous aortobiiliac bypass.  Selective angiograms were taken through the respective sheaths of each visceral artery after stent placement was completed which noted patent target arteries and without any evidence of 1C endoleak.  SPECIMEN(S): None  INDICATIONS:   Drew Cisneros is a 83 y.o. male with a 7cm paravisceral AAA.  In 1995 he had an aortobiiliac bypass with Dr. Melvenia.  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  I saw him in consultation for a 5 cm thoracic aneurysm but on that scan I also noted that his visceral segment had significantly degenerated to 7 cm.  He is also had history of a surgery repair with mesh and an open cholecystectomy.  I explained that given his multiple previous open abdominal surgeries that he would be very high risk for open repair and therefore offered a customized device.  We discussed a physician modified endograft and reviewed  the risks and benefits.  We specifically reviewed the specific risks of bleeding, renal obstruction leading to renal failure, bowel ischemia, spinal cord ischemia which could result in paralysis, access site complication necessitating a groin incision, rupture and death.  The patient expressed  understanding and was willing to proceed.  DESCRIPTION: Prior to the patient entering the operating room I performed a four-vessel physician modified endograft on the back table.  A 34-30-159 tapered Cook Zdeg was used. Based on his preoperative CT measurements and his anatomy we elected to make 2 fenestrations for the celiac and SMA at approximately 12:00.  It should be noted that the celiac fenestration is 60 mm down from the proximal edge of the graft.  Fenestrations were also made for the left and right renals at 9:00 and 2:00 although rather than a normal reinforced fenestration due to the significant angulation and aneurysmal degeneration in this area I sewed on downward going branches to these fenestrations.  A 6 mm Viabahn cut to about an 8 mm length and spatulated were used for these branches.  A posterior constraining wire and 4-0 Prolene sutures were then used to constrain the graft down to approximately 20 mm and the graft was then replaced in its Cook sheath.  A small portion of the first stent of the infrarenal piece was then partially deployed in order to remove the suprarenal fixation and then this was then completely resheathed in its original sheath.  During the physician modification process the patient was brought to the operating room and positioned supine on the OR table. The groins and abdomen were prepped and draped in the usual sterile fashion. A timeout was performed and IV antibiotics were administered.  Bilateral percutaneous access of the CFA was obtained with ultrasound guidance.  Both access ease were dilated with an 8 French dilator and 2 perclose devices were placed in the pre-close technique and bilateral iliac systems were upsized to 8 French sheaths. The patient was systemically heparinized. Via the left access, the Bentson wire was exchanged for a Lunderquist and the 61F sheath was exchanged for the customized device, 34-30-159 [facility] and this was placed over the  Lunderquist wire and into the abdominal aorta.  On the right side a pigtail was placed into the distal thoracic aorta and an aortogram was obtained in a lateral position and the SMA was marked on the screen.  The customized device was tracked into position with the SMA fenestration lined up with our mark on the screen and deployed.  From the right side the pigtail was exchanged over a wire and to the distal portion of the graft was cannulated with a wire and catheter.  This was then exchanged for a Lunderquist wire and the 8 French sheath in the right groin was exchanged for a 20 French dry seal sheath which was placed into the distal aspect of the graft.  Through this sheath on the right side we began cannulating with a 7 French steerable Aptus tour guide sheath.  Using the Aptus sheath, a floppy angled Glidewire and a quick cross catheter I cannulated the SMA and the floppy Glidewire was exchanged for a Rosen wire.  The tour guide sheath was removed over this wire and a 7 French Ansell sheath was placed over this wire through the fenestration and into the SMA.  The 7 French tour guide was then passed through the dry seal sheath on the right again and the celiac artery was cannulated in a similar fashion.  The floppy Glidewire was exchanged for a Rosen wire which was left and placed in the splenic artery.  The tour guide sheath was again advanced through the 20 French dry seal, the left renal fenestration/branch was cannulated although we had significant difficulty cannulating the left renal artery.  This was due to the tortuosity, severe aneurysmal degeneration in this area as well as disease at the ostium.  I attempted multiple different wire and catheter combinations including a 018 wires and catheters but this was unsuccessful.  I then elected to leave a wire out this fenestration and turned attention to the right renal artery.  Using the tour guide sheath and a floppy Glidewire we were able to navigate through  the right renal fenestration/branch although there was also difficulty cannulating the right renal artery.  The graft appeared to be somewhat pushed to the right side of the aorta which was impinging on the right renal branch that I had sewn on.  After multiple attempts with a few different wires and catheters and also exchanging for an 8.5 French tour guide sheath for more stability I was able to cannulate the right renal artery.  A quick cross catheter was placed and the Glidewire was exchanged for a [employer] wire.  At this point I knew we would easily be able to track bridging stents into the SMA and celiac but we had not cannulated the left renal and the right renal had a tenuous cannulation due to the stability and tortuosity.  At this point we elected to fully deployed the graft in order to be able to put up an aortic occluder balloon just above the right renal fenestration in order to use as a backboard and supports the sheath while tracking the bridging stent.  The graft was fully deployed and the posterior constraining wire was removed, fully opening the graft.  The delivery device was then removed over the Lunderquist wire on the left and a 20 French dry seal was placed over this wire and into the distal aspect of the graft.  Over this Lunderquist wire and aortic occluder balloon was placed at the level just above the right renal fenestration and inflated.  This gave us  the stability and push ability to be able to track the VBX stents through the tour guide sheath, through the branch and into the right renal artery.  We first placed a 6 x 39 VBX with adequate overlap into the aortic endograft but this was barely in the ostium of the right renal artery and therefore we tracked a 6 x 29 further distally into the renal artery.  I then turned my attention to the SMA and celiac stents.  Over the SMA wire a 7 French Ansell sheath was placed through the fenestration and into the SMA and through the sheath a 7 mm x  29 mm VBX stent was placed with sufficient overlap into the aortic endograft.  This sheath was removed and placed over the celiac wire and an 8 x 29 VBX was placed into the celiac artery with excellent overlap into the aortic endograft.  We did post dilate this to 9 mm.   I then turned attention back to the left renal artery.  I elected to attempt to cannulate the left renal artery from behind the physician modified endograft in order to stent the ostium which in turn would make cannulating through the fenestration/branch easier.  Using a 6.5 French Aptus tour guide sheath we were able to navigate behind the stent  graft into the left side of the aorta.  After multiple attempts with different wires and catheter combinations we were able to track a V18 wire into the left renal artery and this was exchanged for a Rosen wire via a quick cross catheter.  Over this Rosen wire a 5 mm x 19 mm VBX stent was placed in the ostium of the renal artery.  At the wire and tour guide sheath were pulled back and the bottom edge of the graft was again cannulated.  Using the tour guide sheath, a V18 wire and a quick cross catheter I was able to navigate through the left renal artery fenestration/branch and into the the left renal artery through the previous placed VBX stent at the ostium.  This V18 wire was exchanged for a Rosen wire through a quick cross catheter and a 6 mm x 39 mm VBX stent was placed bridging the branch into the renal artery.  There did not appear to be enough overlap into the aortic endograft and therefore a 6 mm x 19 mm VBX stent was placed proximally in the fenestration.  It should be noted that selective angiograms were taken through the respective sheaths of each visceral artery after stent placement was completed which noted patent target arteries and without any evidence of 1C endoleak. All visceral wires and sheaths were removed and over the Lunderquist wire from the left groin access the 20 French dry  seal sheath was removed and the infrarenal device was tracked into position, Cook TFFB 32-82.  This was deployed with care to keep the proximal edge below the lowest visceral stent and the gate just slightly above the flow divider from the previous aortobiiliac bypass.  This was placed in anatomic position to the previous aortobiiliac bypass.  From the right side the contralateral gate was selected with a catheter and soft glide wire which was exchanged for a Lunderquist wire.  I confirmed that we were in the gate by inflating the aortic balloon and seeing the gate deformed as well as the balloon mushroom.  Over this wire a Cook Z SLE 20-56 iliac limb was placed with max overlap into the gate and adequate purchase into the right iliac bypass limb.  The remainder of the main body was then deployed and the deployment device was removed over the Lunderquist wire from the left groin and a 20 French dry seal sheath was put in place.  From the left side a Bluford PEDLAR SLE 20-74 iliac limb was placed with excellent overlap to the endograft flow divider and adequate purchase into the left iliac bypass limb.  A compliant aortic balloon was then used to balloon the overlap between the fenestrated device and the infrarenal device as well as both of the proximal and distal iliac overlaps.  At this point the patient received a large amount of contrast as well as greater than 6 hours of anesthesia and 3 Elnor of radiation and therefore I elected to forego a completion angiogram at this time.  Given the circumstances and difficulty with the renal arteries this was felt to be an excellent result.  The arteriotomies were closed over a wire using the previously placed perc-close devices with excellent hemostasis. Pedal pulses were checked and found to be stable from pre-op. Heparin  was reversed with protamine  and the skin was closed with 4-0 monocryl suture. Dry sterile dressing was placed. The patient was transported to the recovery room  in stable condition.  COMPLICATIONS: none apparent  CONDITION: stable  [name] Serve MD [facility] Phone Number: [phone] 03/03/2024 4:44 PM     "

## 2024-03-03 NOTE — Progress Notes (Signed)
 Orthopedic Tech Progress Note Patient Details:  Drew Cisneros Jan 16, 1941 [id number]  Ortho Devices Type of Ortho Device: Knee Immobilizer Ortho Device/Splint Location: BLE Ortho Device/Splint Interventions: Ordered, Application   Post Interventions Patient Tolerated: Well  Kele Barthelemy A Tinzley Dalia 03/03/2024, 4:53 PM

## 2024-03-03 NOTE — Anesthesia Procedure Notes (Signed)
 Procedure Name: Intubation Date/Time: 03/03/2024 8:50 AM  Performed by: Zelphia Norleen HERO, CRNAPre-anesthesia Checklist: Patient identified, Emergency Drugs available, Suction available and Patient being monitored Patient Re-evaluated:Patient Re-evaluated prior to induction Oxygen Delivery Method: Circle system utilized Preoxygenation: Pre-oxygenation with 100% oxygen Induction Type: IV induction Ventilation: Mask ventilation without difficulty Laryngoscope Size: Mac and 3 Grade View: Grade I Tube type: Oral Tube size: 7.0 mm Number of attempts: 1 Airway Equipment and Method: Stylet Placement Confirmation: ETT inserted through vocal cords under direct vision, positive ETCO2 and breath sounds checked- equal and bilateral Secured at: 23 cm Tube secured with: Tape Dental Injury: Teeth and Oropharynx as per pre-operative assessment

## 2024-03-04 ENCOUNTER — Encounter (HOSPITAL_COMMUNITY): Payer: Self-pay | Admitting: Vascular Surgery

## 2024-03-04 ENCOUNTER — Ambulatory Visit: Admitting: Student

## 2024-03-04 LAB — BASIC METABOLIC PANEL WITH GFR
Anion gap: 11 (ref 5–15)
BUN: 21 mg/dL (ref 8–23)
CO2: 22 mmol/L (ref 22–32)
Calcium: 7.8 mg/dL — ABNORMAL LOW (ref 8.9–10.3)
Chloride: 106 mmol/L (ref 98–111)
Creatinine, Ser: 1.12 mg/dL (ref 0.61–1.24)
GFR, Estimated: >60 mL/min
Glucose, Bld: 102 mg/dL — ABNORMAL HIGH (ref 70–99)
Potassium: 3.9 mmol/L (ref 3.5–5.1)
Sodium: 139 mmol/L (ref 135–145)

## 2024-03-04 LAB — CBC
HCT: 26.6 % — ABNORMAL LOW (ref 39.0–52.0)
HCT: 27.1 % — ABNORMAL LOW (ref 39.0–52.0)
Hemoglobin: 8.8 g/dL — ABNORMAL LOW (ref 13.0–17.0)
Hemoglobin: 9 g/dL — ABNORMAL LOW (ref 13.0–17.0)
MCH: 27.7 pg (ref 26.0–34.0)
MCH: 28.1 pg (ref 26.0–34.0)
MCHC: 33.1 g/dL (ref 30.0–36.0)
MCHC: 33.2 g/dL (ref 30.0–36.0)
MCV: 83.6 fL (ref 80.0–100.0)
MCV: 84.7 fL (ref 80.0–100.0)
Platelets: 72 K/uL — ABNORMAL LOW (ref 150–400)
Platelets: 72 K/uL — ABNORMAL LOW (ref 150–400)
RBC: 3.18 MIL/uL — ABNORMAL LOW (ref 4.22–5.81)
RBC: 3.2 MIL/uL — ABNORMAL LOW (ref 4.22–5.81)
RDW: 14.6 % (ref 11.5–15.5)
RDW: 14.6 % (ref 11.5–15.5)
WBC: 9.5 K/uL (ref 4.0–10.5)
WBC: 7.8 K/uL (ref 4.0–10.5)
nRBC: 0 % (ref 0.0–0.2)
nRBC: 0 % (ref 0.0–0.2)

## 2024-03-04 LAB — POCT ACTIVATED CLOTTING TIME
Activated Clotting Time: 199 s
Activated Clotting Time: 214 s
Activated Clotting Time: 245 s
Activated Clotting Time: 276 s
Activated Clotting Time: 266 s
Activated Clotting Time: 220 s
Activated Clotting Time: 230 s
Activated Clotting Time: 245 s
Activated Clotting Time: 250 s
Activated Clotting Time: 138 s

## 2024-03-04 MED ORDER — CLOPIDOGREL BISULFATE 75 MG PO TABS: 75.0000 mg | ORAL_TABLET | Freq: Every day | ORAL | 3 refills | Status: AC

## 2024-03-04 MED ORDER — ASPIRIN 81 MG PO TBEC: 81.0000 mg | DELAYED_RELEASE_TABLET | Freq: Every day | ORAL | 12 refills | Status: AC

## 2024-03-04 MED ORDER — ROSUVASTATIN CALCIUM 20 MG PO TABS: 20.0000 mg | ORAL_TABLET | Freq: Every day | ORAL | 11 refills | Status: AC

## 2024-03-04 MED FILL — Fentanyl Citrate Preservative Free (PF) Inj 100 MCG/2ML: INTRAMUSCULAR | Qty: 3 | Status: AC

## 2024-03-04 NOTE — Progress Notes (Addendum)
" °  Daily Progress Note  S/p: 4v FEVAR  Subjective: Confused overnight, helped to sit up on side of bed and eat breakfast  Objective: Vitals:   03/03/24 1836 03/04/24 0310  BP: (!) 145/85 127/69  Pulse: 69 93  Resp: 10 13  Temp: 97.7 F (36.5 C) 97.9 F (36.6 C)  SpO2: 96% 96%    Physical Examination HDS Nonlabored breathing Groins clean and dry with small hematomas Palpable DP bilaterally   ASSESSMENT/PLAN:  83 y/o male s/p 4v FEVAR. Postop day 1. Recovering well despite confusion over night  Discontinue narcotics  Cr stable, Remove foley Abmbuate in hall this morning Recheck CBC later this morning. Hbg this morning 8.8 from 10.8. likely due to surgical blood loss. No signs of on-going bleeding   [name] Serve MD [facility] [phone] 03/04/2024  7:36 AM   He has ambulated in the hall, urinated since the catheter was removed and his recheck cbc demonstrated a HGB of 9.0. He and his wife are interested in going home and I do believe he is medically safe for discharge at this time.  Will be prescribed plavix and crestor on DC. "

## 2024-03-04 NOTE — Progress Notes (Signed)
" °   03/04/24 1230  TOC Brief Assessment  Insurance and Status Reviewed  Patient has primary care physician Yes  Home environment has been reviewed home w/ spouse  Prior level of function: independent  Prior/Current Home Services No current home services  Social Drivers of Health Review SDOH reviewed no interventions necessary  Readmission risk has been reviewed Yes  Transition of care needs no transition of care needs at this time    Pt s/p vascular procedure, stable for transition home today, wife at bedside and will transport home. No IPCM needs noted.  "

## 2024-03-04 NOTE — Discharge Instructions (Signed)

## 2024-03-09 NOTE — Discharge Summary (Signed)
 Physician Discharge Summary  Patient ID: Drew Cisneros MRN: [id number] DOB/AGE: August 07, 1940 84 y.o.  Admit date: 03/03/2024 Discharge date: 03/09/2024  Admission Diagnoses:  Discharge Diagnoses:  Principal Problem:   AAA (abdominal aortic aneurysm)   Discharged Condition: stable  Hospital Course: Admitted postoperatively after elective 4v FEVAR for paravisceral AAA.  He recovered well.  He has acute blood loss anemia due to surgical blood loss and that stabilized 24 hours after surgery.  On postop day 1 he was ambulating at his baseline.  Urinating with his Foley out and tolerating a diet.  He was deemed stable for discharge.  Consults: None  Treatments: surgery: Four-vessel FEVAR  Discharge Exam: Blood pressure 118/60, pulse 82, temperature 97.9 F (36.6 C), temperature source Oral, resp. rate 16, height 5' 8 (1.727 m), weight 61.2 kg, SpO2 98%. HDS Nonlabored breathing Groins clean and dry with small hematomas Palpable DP bilaterally   Disposition: Discharge disposition: 01-Home or Self Care        Allergies as of 03/04/2024       Reactions   Codeine Other (See Comments)   Severe Headaches   Penicillins Rash   Atenolol Other (See Comments)   Ear issues   Codeine    Compazine    Fenofibrate    Lovaza [omega-3-acid Ethyl Esters (fish)]    Penicillins    Prednisone Other (See Comments)   Increase in BP   Zetia [ezetimibe]         Medication List     TAKE these medications    aspirin  EC 81 MG tablet Take 1 tablet (81 mg total) by mouth daily. Swallow whole. Continue indefinitely What changed: additional instructions   carvedilol  12.5 MG tablet Commonly known as: COREG  Take 12.5 mg by mouth 2 (two) times daily.   clopidogrel  75 MG tablet Commonly known as: PLAVIX  Take 1 tablet (75 mg total) by mouth daily at 6 (six) AM.   diazepam  5 MG tablet Commonly known as: VALIUM  Take 5 mg by mouth daily.   diazepam  5 MG tablet Commonly known as:  VALIUM  Take 5 mg by mouth at bedtime.   doxazosin  8 MG tablet Commonly known as: CARDURA  Take 8 mg by mouth 2 (two) times daily.   propranolol  60 MG tablet Commonly known as: INDERAL  Take 60 mg by mouth 2 (two) times daily.   propranolol  ER 60 MG 24 hr capsule Commonly known as: INDERAL  LA Take 60 mg by mouth 2 (two) times daily.   rosuvastatin  20 MG tablet Commonly known as: Crestor  Take 1 tablet (20 mg total) by mouth daily.   traMADol  50 MG tablet Commonly known as: ULTRAM  Take 1 tablet (50 mg total) by mouth every 6 (six) hours as needed.   VISINE OP Place 1 drop into both eyes daily as needed (dry eyes).   VITAMIN B12 PO Take 1,000 mcg by mouth every morning.        Follow-up Information     [facility] [place] Follow up in 1 month(s).   Specialty: Vascular Surgery Why: The office will call you with your appointment Contact information: [address], [address] [place]  [address] [phone]                Signed: [name] Serve 03/09/2024, 11:02 AM

## 2024-03-11 ENCOUNTER — Other Ambulatory Visit: Payer: Self-pay

## 2024-03-11 DIAGNOSIS — I7162 Paravisceral aneurysm of the thoracoabdominal aorta, without rupture: Secondary | ICD-10-CM

## 2024-03-23 ENCOUNTER — Encounter (HOSPITAL_COMMUNITY): Payer: Self-pay | Admitting: Vascular Surgery

## 2024-03-25 ENCOUNTER — Inpatient Hospital Stay (HOSPITAL_BASED_OUTPATIENT_CLINIC_OR_DEPARTMENT_OTHER)
Admission: RE | Admit: 2024-03-25 | Discharge: 2024-03-25 | Disposition: A | Source: Ambulatory Visit | Attending: Vascular Surgery | Admitting: Radiology

## 2024-03-25 ENCOUNTER — Encounter (HOSPITAL_COMMUNITY): Payer: Self-pay | Admitting: Vascular Surgery

## 2024-03-25 DIAGNOSIS — I7162 Paravisceral aneurysm of the thoracoabdominal aorta, without rupture: Secondary | ICD-10-CM

## 2024-03-25 MED ORDER — IOHEXOL 350 MG/ML SOLN
100.0000 mL | Freq: Once | INTRAVENOUS | Status: AC | PRN
  Administered 2024-03-25: 100 mL via INTRAVENOUS

## 2024-03-27 ENCOUNTER — Telehealth: Payer: Self-pay

## 2024-03-27 NOTE — Telephone Encounter (Signed)
 Pt's wife, Orlean, called. She reported pt is very weak and nauseated, eating one meal a day, fell  2 days ago in the bathroom. Pearline did a 4v FEVAR on 12/30. Orlean also reported patient is short of breath.  Advised to go to ED. Orlean reported he doesn't want to go to the ED.

## 2024-04-09 NOTE — Progress Notes (Unsigned)
 "  Patient ID: Drew Cisneros, male   DOB: 09-01-1940, 84 y.o.   MRN: [id number]  Reason for Consult: Routine Post Op   Referred by Nodal, Mickey Browner, PA-C  Subjective:     HPI Drew Cisneros is a 84 y.o. male presenting for follow-up of AAA.  On 03/03/2024 he underwent four-vessel fenestrated endovascular repair for his 7 cm perivisceral AAA.  Since his surgery he has dealt with a multitude of issues including fatigue, loss of appetite, constipation and overall does not feeling well.  He continues to take his blood pressure medicine as well as propranolol  regularly although the wife admits that his heart rate and blood pressure have been relatively low normal.  He is not doing much throughout the day and not eating or drinking much either.  He is unbalanced when he walks although this was present preop.  He continues to be compliant with aspirin , Plavix  and cholesterol medication.  Past Medical History:  Diagnosis Date   AAA (abdominal aortic aneurysm)    S/P AAA repair. Infra renal AAA measures 4.0 cm in August of 2012   CHF (congestive heart failure) (HCC)    Coronary artery disease    a. Unsuccessful PCI to RCA in 2010 --> medically managed. b. 2016: NST showing no evidence of ischemia   DVT (deep venous thrombosis) (HCC)    post lumbar surgery   Gout    Holter monitor, abnormal October 2012   with only rare PVC and PAC, NO pauses, No significant bradycardia   Hypercholesterolemia    Hypertension    Iliac aneurysm    S/P bilateral iliac repair   Indigestion    ITP (idiopathic thrombocytopenic purpura)    Nausea    Normal nuclear stress test October 2012   EF 72% with no ischemia   PAD (peripheral artery disease)    followed by Dr. Eliza   Renal artery stenosis    Left per cardiac cath in 2010   Thoracic aortic aneurysm    followed by Dr. Lucas   Thrombocytopenia    Family History  Problem Relation Age of Onset   Diabetes Mother    Diabetes Brother    Coronary  artery disease Brother    Past Surgical History:  Procedure Laterality Date   ABDOMINAL AORTIC ANEURYSM REPAIR     ABDOMINAL AORTIC ENDOVASCULAR FENESTRATED STENT GRAFT N/A 03/03/2024   Procedure: ABDOMINAL AORTIC ENDOVASCULAR FENESTRATED STENT GRAFT;  Surgeon: Pearline Norman RAMAN, MD;  Location: [facility];  Service: Cardiovascular;  Laterality: N/A;   CARDIAC CATHETERIZATION  2010   EF 60% with single vessel CAD. He underwent attempt at PCI  with dissection and had successful stenting of the prox RCA but unsuccessful PCI to the mid RCA. Mild to moderate disease in the left system noted.    CHOLECYSTECTOMY     ILIAC ARTERY ANEURYSM REPAIR     INGUINAL HERNIA REPAIR     LUMBAR SPINE SURGERY     ROTATOR CUFF REPAIR W/ DISTAL CLAVICLE EXCISION     UMBILICAL HERNIA REPAIR      Short Social History:  Social History   Tobacco Use   Smoking status: Former    Current packs/day: 0.00    Types: Cigarettes    Quit date: 1995    Years since quitting: 31.1   Smokeless tobacco: Never  Substance Use Topics   Alcohol use: No    Allergies[1]  Current Outpatient Medications  Medication Sig Dispense Refill   aspirin   EC 81 MG tablet Take 1 tablet (81 mg total) by mouth daily. Swallow whole. Continue indefinitely 30 tablet 12   carvedilol  (COREG ) 12.5 MG tablet Take 12.5 mg by mouth 2 (two) times daily.     clopidogrel  (PLAVIX ) 75 MG tablet Take 1 tablet (75 mg total) by mouth daily at 6 (six) AM. 90 tablet 3   Cyanocobalamin (VITAMIN B12 PO) Take 1,000 mcg by mouth every morning.     diazepam  (VALIUM ) 5 MG tablet Take 5 mg by mouth daily.     diazepam  (VALIUM ) 5 MG tablet Take 5 mg by mouth at bedtime.     doxazosin  (CARDURA ) 8 MG tablet Take 8 mg by mouth 2 (two) times daily.     Naphazoline-Pheniramine (VISINE OP) Place 1 drop into both eyes daily as needed (dry eyes).     propranolol  (INDERAL ) 60 MG tablet Take 60 mg by mouth 2 (two) times daily.     propranolol  ER (INDERAL  LA) 60 MG  24 hr capsule Take 60 mg by mouth 2 (two) times daily.     rosuvastatin  (CRESTOR ) 20 MG tablet Take 1 tablet (20 mg total) by mouth daily. 30 tablet 11   traMADol  (ULTRAM ) 50 MG tablet Take 1 tablet (50 mg total) by mouth every 6 (six) hours as needed. 15 tablet 0   No current facility-administered medications for this visit.    REVIEW OF SYSTEMS  All other systems were reviewed and are negative     Objective:  Objective   Vitals:   04/10/24 1432  BP: (!) 88/59  Pulse: 78  Temp: 98.3 F (36.8 C)  SpO2: (!) 89%  Weight: 133 lb (60.3 kg)  Height: 5' 8 (1.727 m)   Body mass index is 20.22 kg/m.  Physical Exam General: no acute distress Cardiac: hemodynamically stable Extremities: no edema, cyanosis or wounds  Data: CTA reviewed. The fenestrated repair appears to be in excellent position without any evidence of endoleak.  The visceral stents and renal stents are widely patent.     Assessment/Plan:   Drew Cisneros is a 84 y.o. male with a juxta visceral aneurysm who underwent four-vessel fenestrated repair on 03/03/2024 and he was discharged the following day. He has been dealing with constipation, fatigue and loss of appetite since his surgery.  We had an extensive discussion about his blood pressure and heart rate medications and I instructed him to stop taking these as his wife admitted he was taking his propranolol  even when his heart rate was in the 80s.  His blood pressure today is relatively low with a systolic of 88 although he is mentating normally.  I explained that taking his blood pressure medications and his beta-blocker could be contributing to his fatigue but overall that 84 years old with a 7-hour surgery it is fairly normal to not feel well for about a month. He should discuss restarting his blood pressure medications with his PCP at his next appointment.  I also encouraged him to start to slowly increase his p.o. intake as he has not been eating much which  could also contribute to his fatigue.  I also encouraged him to have a small glass of MiraLAX  with every meal to try to help with his constipation.  Overall his aneurysm is well sealed although he has been very slow to recover from the general anesthesia in the 7-hour surgery which is somewhat expected.  He needs to slowly try to increase his p.o. intake and his activity and we  will plan to follow-up in 6 months with a repeat CTA   [name] Serve MD [facility]     [1]  Allergies Allergen Reactions   Codeine Other (See Comments)    Severe Headaches   Penicillins Rash   Atenolol Other (See Comments)    Ear issues   Codeine    Compazine    Fenofibrate    Lovaza [Omega-3-Acid Ethyl Esters (Fish)]    Penicillins    Prednisone Other (See Comments)    Increase in BP   Zetia [Ezetimibe]    "

## 2024-04-10 ENCOUNTER — Encounter: Payer: Self-pay | Admitting: Vascular Surgery

## 2024-04-10 ENCOUNTER — Ambulatory Visit: Admitting: Vascular Surgery

## 2024-04-10 VITALS — BP 88/59 | HR 78 | Temp 98.3°F | Ht 68.0 in | Wt 133.0 lb

## 2024-04-10 DIAGNOSIS — I7162 Paravisceral aneurysm of the thoracoabdominal aorta, without rupture: Secondary | ICD-10-CM

## 2024-10-09 ENCOUNTER — Ambulatory Visit: Admitting: Vascular Surgery
